# Patient Record
Sex: Female | Born: 1980 | Race: White | Hispanic: No | Marital: Married | Smoking: Current every day smoker
Health system: Southern US, Community
[De-identification: ages and names within clinical notes are randomized; demographics above are authoritative.]

## PROBLEM LIST (undated history)

## (undated) DIAGNOSIS — G8929 Other chronic pain: Secondary | ICD-10-CM

## (undated) DIAGNOSIS — N289 Disorder of kidney and ureter, unspecified: Secondary | ICD-10-CM

## (undated) DIAGNOSIS — G4700 Insomnia, unspecified: Secondary | ICD-10-CM

## (undated) DIAGNOSIS — M549 Dorsalgia, unspecified: Secondary | ICD-10-CM

## (undated) DIAGNOSIS — F1910 Other psychoactive substance abuse, uncomplicated: Secondary | ICD-10-CM

## (undated) DIAGNOSIS — K219 Gastro-esophageal reflux disease without esophagitis: Secondary | ICD-10-CM

## (undated) HISTORY — PX: FOOT SURGERY: SHX648

## (undated) HISTORY — PX: TUBAL LIGATION: SHX77

---

## 2002-12-13 ENCOUNTER — Inpatient Hospital Stay (HOSPITAL_COMMUNITY): Admission: AD | Admit: 2002-12-13 | Discharge: 2002-12-15 | Payer: Self-pay | Admitting: *Deleted

## 2003-07-23 ENCOUNTER — Emergency Department (HOSPITAL_COMMUNITY): Admission: EM | Admit: 2003-07-23 | Discharge: 2003-07-23 | Payer: Self-pay | Admitting: Emergency Medicine

## 2003-09-29 ENCOUNTER — Emergency Department (HOSPITAL_COMMUNITY): Admission: EM | Admit: 2003-09-29 | Discharge: 2003-09-30 | Payer: Self-pay | Admitting: Emergency Medicine

## 2003-10-05 ENCOUNTER — Emergency Department (HOSPITAL_COMMUNITY): Admission: EM | Admit: 2003-10-05 | Discharge: 2003-10-05 | Payer: Self-pay | Admitting: *Deleted

## 2003-11-26 ENCOUNTER — Observation Stay (HOSPITAL_COMMUNITY): Admission: AD | Admit: 2003-11-26 | Discharge: 2003-11-27 | Payer: Self-pay | Admitting: *Deleted

## 2003-12-26 ENCOUNTER — Ambulatory Visit (HOSPITAL_COMMUNITY): Admission: RE | Admit: 2003-12-26 | Discharge: 2003-12-26 | Payer: Self-pay | Admitting: *Deleted

## 2004-01-08 ENCOUNTER — Ambulatory Visit (HOSPITAL_COMMUNITY): Admission: AD | Admit: 2004-01-08 | Discharge: 2004-01-08 | Payer: Self-pay | Admitting: *Deleted

## 2004-01-25 ENCOUNTER — Ambulatory Visit (HOSPITAL_COMMUNITY): Admission: AD | Admit: 2004-01-25 | Discharge: 2004-01-25 | Payer: Self-pay | Admitting: *Deleted

## 2004-02-25 ENCOUNTER — Ambulatory Visit (HOSPITAL_COMMUNITY): Admission: AD | Admit: 2004-02-25 | Discharge: 2004-02-25 | Payer: Self-pay | Admitting: *Deleted

## 2004-03-18 ENCOUNTER — Ambulatory Visit (HOSPITAL_COMMUNITY): Admission: AD | Admit: 2004-03-18 | Discharge: 2004-03-18 | Payer: Self-pay | Admitting: *Deleted

## 2004-04-05 ENCOUNTER — Ambulatory Visit (HOSPITAL_COMMUNITY): Admission: AD | Admit: 2004-04-05 | Discharge: 2004-04-05 | Payer: Self-pay | Admitting: *Deleted

## 2004-04-27 ENCOUNTER — Ambulatory Visit (HOSPITAL_COMMUNITY): Admission: AD | Admit: 2004-04-27 | Discharge: 2004-04-27 | Payer: Self-pay | Admitting: *Deleted

## 2004-05-06 ENCOUNTER — Inpatient Hospital Stay (HOSPITAL_COMMUNITY): Admission: RE | Admit: 2004-05-06 | Discharge: 2004-05-07 | Payer: Self-pay | Admitting: Obstetrics and Gynecology

## 2004-06-06 ENCOUNTER — Emergency Department (HOSPITAL_COMMUNITY): Admission: EM | Admit: 2004-06-06 | Discharge: 2004-06-06 | Payer: Self-pay | Admitting: Emergency Medicine

## 2004-06-20 ENCOUNTER — Emergency Department (HOSPITAL_COMMUNITY): Admission: EM | Admit: 2004-06-20 | Discharge: 2004-06-20 | Payer: Self-pay | Admitting: Emergency Medicine

## 2004-07-02 ENCOUNTER — Emergency Department (HOSPITAL_COMMUNITY): Admission: EM | Admit: 2004-07-02 | Discharge: 2004-07-03 | Payer: Self-pay | Admitting: *Deleted

## 2004-07-31 ENCOUNTER — Emergency Department (HOSPITAL_COMMUNITY): Admission: EM | Admit: 2004-07-31 | Discharge: 2004-07-31 | Payer: Self-pay | Admitting: Emergency Medicine

## 2004-08-06 ENCOUNTER — Emergency Department (HOSPITAL_COMMUNITY): Admission: EM | Admit: 2004-08-06 | Discharge: 2004-08-07 | Payer: Self-pay | Admitting: Emergency Medicine

## 2004-08-24 ENCOUNTER — Emergency Department (HOSPITAL_COMMUNITY): Admission: EM | Admit: 2004-08-24 | Discharge: 2004-08-25 | Payer: Self-pay

## 2004-09-19 ENCOUNTER — Emergency Department (HOSPITAL_COMMUNITY): Admission: EM | Admit: 2004-09-19 | Discharge: 2004-09-19 | Payer: Self-pay | Admitting: Emergency Medicine

## 2004-10-07 ENCOUNTER — Ambulatory Visit: Payer: Self-pay | Admitting: Occupational Therapy

## 2004-10-09 ENCOUNTER — Emergency Department (HOSPITAL_COMMUNITY): Admission: EM | Admit: 2004-10-09 | Discharge: 2004-10-09 | Payer: Self-pay | Admitting: Emergency Medicine

## 2004-11-28 ENCOUNTER — Emergency Department (HOSPITAL_COMMUNITY): Admission: EM | Admit: 2004-11-28 | Discharge: 2004-11-28 | Payer: Self-pay | Admitting: Emergency Medicine

## 2004-12-18 ENCOUNTER — Emergency Department (HOSPITAL_COMMUNITY): Admission: EM | Admit: 2004-12-18 | Discharge: 2004-12-18 | Payer: Self-pay | Admitting: Emergency Medicine

## 2005-01-31 ENCOUNTER — Emergency Department (HOSPITAL_COMMUNITY): Admission: EM | Admit: 2005-01-31 | Discharge: 2005-01-31 | Payer: Self-pay | Admitting: *Deleted

## 2005-02-11 ENCOUNTER — Emergency Department (HOSPITAL_COMMUNITY): Admission: EM | Admit: 2005-02-11 | Discharge: 2005-02-12 | Payer: Self-pay | Admitting: *Deleted

## 2005-03-11 ENCOUNTER — Ambulatory Visit (HOSPITAL_COMMUNITY): Admission: RE | Admit: 2005-03-11 | Discharge: 2005-03-11 | Payer: Self-pay | Admitting: Family Medicine

## 2005-07-09 ENCOUNTER — Ambulatory Visit (HOSPITAL_COMMUNITY): Admission: AD | Admit: 2005-07-09 | Discharge: 2005-07-09 | Payer: Self-pay | Admitting: Obstetrics and Gynecology

## 2005-08-07 ENCOUNTER — Ambulatory Visit (HOSPITAL_COMMUNITY): Admission: AD | Admit: 2005-08-07 | Discharge: 2005-08-07 | Payer: Self-pay | Admitting: Obstetrics and Gynecology

## 2005-08-16 ENCOUNTER — Ambulatory Visit (HOSPITAL_COMMUNITY): Admission: AD | Admit: 2005-08-16 | Discharge: 2005-08-16 | Payer: Self-pay | Admitting: Obstetrics and Gynecology

## 2005-08-18 ENCOUNTER — Ambulatory Visit (HOSPITAL_COMMUNITY): Admission: AD | Admit: 2005-08-18 | Discharge: 2005-08-18 | Payer: Self-pay | Admitting: Obstetrics and Gynecology

## 2005-10-13 ENCOUNTER — Ambulatory Visit (HOSPITAL_COMMUNITY): Admission: RE | Admit: 2005-10-13 | Discharge: 2005-10-13 | Payer: Self-pay | Admitting: Obstetrics and Gynecology

## 2005-10-16 ENCOUNTER — Ambulatory Visit (HOSPITAL_COMMUNITY): Admission: RE | Admit: 2005-10-16 | Discharge: 2005-10-16 | Payer: Self-pay | Admitting: Obstetrics and Gynecology

## 2005-10-22 ENCOUNTER — Ambulatory Visit (HOSPITAL_COMMUNITY): Admission: AD | Admit: 2005-10-22 | Discharge: 2005-10-22 | Payer: Self-pay | Admitting: Obstetrics and Gynecology

## 2005-10-26 ENCOUNTER — Inpatient Hospital Stay (HOSPITAL_COMMUNITY): Admission: AD | Admit: 2005-10-26 | Discharge: 2005-10-28 | Payer: Self-pay | Admitting: Obstetrics and Gynecology

## 2005-12-01 ENCOUNTER — Emergency Department (HOSPITAL_COMMUNITY): Admission: EM | Admit: 2005-12-01 | Discharge: 2005-12-01 | Payer: Self-pay | Admitting: Emergency Medicine

## 2005-12-07 ENCOUNTER — Ambulatory Visit (HOSPITAL_COMMUNITY): Admission: RE | Admit: 2005-12-07 | Discharge: 2005-12-07 | Payer: Self-pay | Admitting: Obstetrics & Gynecology

## 2006-01-26 ENCOUNTER — Emergency Department (HOSPITAL_COMMUNITY): Admission: EM | Admit: 2006-01-26 | Discharge: 2006-01-26 | Payer: Self-pay | Admitting: Emergency Medicine

## 2006-03-01 ENCOUNTER — Emergency Department (HOSPITAL_COMMUNITY): Admission: EM | Admit: 2006-03-01 | Discharge: 2006-03-01 | Payer: Self-pay | Admitting: Emergency Medicine

## 2006-03-28 ENCOUNTER — Emergency Department (HOSPITAL_COMMUNITY): Admission: EM | Admit: 2006-03-28 | Discharge: 2006-03-29 | Payer: Self-pay | Admitting: Emergency Medicine

## 2006-04-26 ENCOUNTER — Emergency Department (HOSPITAL_COMMUNITY): Admission: EM | Admit: 2006-04-26 | Discharge: 2006-04-26 | Payer: Self-pay | Admitting: Emergency Medicine

## 2006-05-28 ENCOUNTER — Emergency Department (HOSPITAL_COMMUNITY): Admission: EM | Admit: 2006-05-28 | Discharge: 2006-05-28 | Payer: Self-pay | Admitting: Emergency Medicine

## 2006-07-02 ENCOUNTER — Emergency Department (HOSPITAL_COMMUNITY): Admission: EM | Admit: 2006-07-02 | Discharge: 2006-07-02 | Payer: Self-pay | Admitting: Emergency Medicine

## 2006-08-02 ENCOUNTER — Emergency Department (HOSPITAL_COMMUNITY): Admission: EM | Admit: 2006-08-02 | Discharge: 2006-08-02 | Payer: Self-pay | Admitting: Emergency Medicine

## 2006-08-12 ENCOUNTER — Emergency Department (HOSPITAL_COMMUNITY): Admission: EM | Admit: 2006-08-12 | Discharge: 2006-08-12 | Payer: Self-pay | Admitting: Emergency Medicine

## 2006-09-09 ENCOUNTER — Emergency Department (HOSPITAL_COMMUNITY): Admission: EM | Admit: 2006-09-09 | Discharge: 2006-09-09 | Payer: Self-pay | Admitting: Emergency Medicine

## 2006-10-02 ENCOUNTER — Emergency Department (HOSPITAL_COMMUNITY): Admission: EM | Admit: 2006-10-02 | Discharge: 2006-10-02 | Payer: Self-pay | Admitting: Emergency Medicine

## 2006-11-05 ENCOUNTER — Emergency Department (HOSPITAL_COMMUNITY): Admission: EM | Admit: 2006-11-05 | Discharge: 2006-11-05 | Payer: Self-pay | Admitting: Emergency Medicine

## 2007-01-17 ENCOUNTER — Emergency Department (HOSPITAL_COMMUNITY): Admission: EM | Admit: 2007-01-17 | Discharge: 2007-01-17 | Payer: Self-pay | Admitting: Emergency Medicine

## 2007-02-01 ENCOUNTER — Emergency Department (HOSPITAL_COMMUNITY): Admission: EM | Admit: 2007-02-01 | Discharge: 2007-02-01 | Payer: Self-pay | Admitting: Emergency Medicine

## 2007-05-09 ENCOUNTER — Emergency Department (HOSPITAL_COMMUNITY): Admission: EM | Admit: 2007-05-09 | Discharge: 2007-05-09 | Payer: Self-pay | Admitting: Emergency Medicine

## 2007-06-25 ENCOUNTER — Emergency Department (HOSPITAL_COMMUNITY): Admission: EM | Admit: 2007-06-25 | Discharge: 2007-06-25 | Payer: Self-pay | Admitting: Emergency Medicine

## 2007-08-06 ENCOUNTER — Emergency Department (HOSPITAL_COMMUNITY): Admission: EM | Admit: 2007-08-06 | Discharge: 2007-08-06 | Payer: Self-pay | Admitting: Emergency Medicine

## 2007-10-15 ENCOUNTER — Emergency Department (HOSPITAL_COMMUNITY): Admission: EM | Admit: 2007-10-15 | Discharge: 2007-10-15 | Payer: Self-pay | Admitting: Emergency Medicine

## 2007-11-02 ENCOUNTER — Emergency Department (HOSPITAL_COMMUNITY): Admission: EM | Admit: 2007-11-02 | Discharge: 2007-11-02 | Payer: Self-pay | Admitting: Emergency Medicine

## 2007-12-30 ENCOUNTER — Emergency Department (HOSPITAL_COMMUNITY): Admission: EM | Admit: 2007-12-30 | Discharge: 2007-12-30 | Payer: Self-pay | Admitting: Emergency Medicine

## 2008-01-21 ENCOUNTER — Emergency Department (HOSPITAL_COMMUNITY): Admission: EM | Admit: 2008-01-21 | Discharge: 2008-01-21 | Payer: Self-pay | Admitting: Emergency Medicine

## 2008-03-03 ENCOUNTER — Emergency Department (HOSPITAL_COMMUNITY): Admission: EM | Admit: 2008-03-03 | Discharge: 2008-03-03 | Payer: Self-pay | Admitting: Emergency Medicine

## 2008-03-08 ENCOUNTER — Emergency Department (HOSPITAL_COMMUNITY): Admission: EM | Admit: 2008-03-08 | Discharge: 2008-03-08 | Payer: Self-pay | Admitting: Emergency Medicine

## 2008-04-19 ENCOUNTER — Emergency Department (HOSPITAL_COMMUNITY): Admission: EM | Admit: 2008-04-19 | Discharge: 2008-04-19 | Payer: Self-pay | Admitting: Emergency Medicine

## 2008-05-24 ENCOUNTER — Emergency Department (HOSPITAL_COMMUNITY): Admission: EM | Admit: 2008-05-24 | Discharge: 2008-05-24 | Payer: Self-pay | Admitting: *Deleted

## 2008-07-16 ENCOUNTER — Emergency Department (HOSPITAL_COMMUNITY): Admission: EM | Admit: 2008-07-16 | Discharge: 2008-07-16 | Payer: Self-pay | Admitting: Emergency Medicine

## 2009-01-04 ENCOUNTER — Emergency Department (HOSPITAL_COMMUNITY): Admission: EM | Admit: 2009-01-04 | Discharge: 2009-01-04 | Payer: Self-pay | Admitting: Emergency Medicine

## 2010-03-16 ENCOUNTER — Emergency Department (HOSPITAL_COMMUNITY): Admission: EM | Admit: 2010-03-16 | Discharge: 2010-03-16 | Payer: Self-pay | Admitting: Emergency Medicine

## 2010-04-18 ENCOUNTER — Emergency Department (HOSPITAL_COMMUNITY): Admission: EM | Admit: 2010-04-18 | Discharge: 2010-04-18 | Payer: Self-pay | Admitting: Emergency Medicine

## 2010-08-01 ENCOUNTER — Emergency Department (HOSPITAL_COMMUNITY): Admission: EM | Admit: 2010-08-01 | Discharge: 2010-08-01 | Payer: Self-pay | Admitting: Emergency Medicine

## 2010-09-04 ENCOUNTER — Emergency Department (HOSPITAL_COMMUNITY): Admission: EM | Admit: 2010-09-04 | Discharge: 2010-09-04 | Payer: Self-pay | Admitting: Emergency Medicine

## 2010-11-21 ENCOUNTER — Emergency Department (HOSPITAL_COMMUNITY)
Admission: EM | Admit: 2010-11-21 | Discharge: 2010-11-21 | Payer: Self-pay | Source: Home / Self Care | Admitting: Emergency Medicine

## 2010-12-25 ENCOUNTER — Emergency Department (HOSPITAL_COMMUNITY): Payer: BC Managed Care – PPO

## 2010-12-25 ENCOUNTER — Emergency Department (HOSPITAL_COMMUNITY)
Admission: EM | Admit: 2010-12-25 | Discharge: 2010-12-25 | Discharge status: Against Medical Advice | Payer: BC Managed Care – PPO | Attending: Emergency Medicine | Admitting: Emergency Medicine

## 2010-12-25 DIAGNOSIS — M25579 Pain in unspecified ankle and joints of unspecified foot: Secondary | ICD-10-CM | POA: Insufficient documentation

## 2011-01-25 LAB — URINALYSIS, ROUTINE W REFLEX MICROSCOPIC
Bilirubin Urine: NEGATIVE
Glucose, UA: NEGATIVE mg/dL
Ketones, ur: NEGATIVE mg/dL
Nitrite: NEGATIVE
Protein, ur: NEGATIVE mg/dL
Specific Gravity, Urine: >1.03 — ABNORMAL HIGH (ref 1.005–1.030)
Urobilinogen, UA: 0.2 mg/dL (ref 0.0–1.0)
pH: 5 (ref 5.0–8.0)

## 2011-01-25 LAB — URINE CULTURE: Colony Count: 8000

## 2011-01-25 LAB — URINE MICROSCOPIC-ADD ON

## 2011-01-25 LAB — WET PREP, GENITAL
Clue Cells Wet Prep HPF POC: NONE SEEN
Trich, Wet Prep: NONE SEEN
WBC, Wet Prep HPF POC: NONE SEEN
Yeast Wet Prep HPF POC: NONE SEEN

## 2011-01-25 LAB — GC/CHLAMYDIA PROBE AMP, GENITAL
Chlamydia, DNA Probe: NEGATIVE
GC Probe Amp, Genital: NEGATIVE

## 2011-01-25 LAB — POCT PREGNANCY, URINE: Preg Test, Ur: NEGATIVE

## 2011-02-11 ENCOUNTER — Emergency Department (HOSPITAL_COMMUNITY)
Admission: EM | Admit: 2011-02-11 | Discharge: 2011-02-12 | Disposition: A | Discharge status: Home / Self Care | Payer: BC Managed Care – PPO | Attending: Emergency Medicine | Admitting: Emergency Medicine

## 2011-02-11 DIAGNOSIS — R10819 Abdominal tenderness, unspecified site: Secondary | ICD-10-CM | POA: Insufficient documentation

## 2011-02-11 DIAGNOSIS — K219 Gastro-esophageal reflux disease without esophagitis: Secondary | ICD-10-CM | POA: Insufficient documentation

## 2011-02-11 DIAGNOSIS — R112 Nausea with vomiting, unspecified: Secondary | ICD-10-CM | POA: Insufficient documentation

## 2011-02-11 DIAGNOSIS — R197 Diarrhea, unspecified: Secondary | ICD-10-CM | POA: Insufficient documentation

## 2011-02-11 DIAGNOSIS — R109 Unspecified abdominal pain: Secondary | ICD-10-CM | POA: Insufficient documentation

## 2011-02-12 ENCOUNTER — Emergency Department (HOSPITAL_COMMUNITY): Payer: BC Managed Care – PPO

## 2011-02-12 LAB — COMPREHENSIVE METABOLIC PANEL
ALT: 21 U/L (ref 0–35)
Alkaline Phosphatase: 65 U/L (ref 39–117)
BUN: 9 mg/dL (ref 6–23)
Chloride: 105 mEq/L (ref 96–112)
Glucose, Bld: 94 mg/dL (ref 70–99)
Potassium: 3.4 mEq/L — ABNORMAL LOW (ref 3.5–5.1)
Sodium: 138 mEq/L (ref 135–145)
Total Bilirubin: 0.1 mg/dL — ABNORMAL LOW (ref 0.3–1.2)

## 2011-02-12 LAB — URINALYSIS, ROUTINE W REFLEX MICROSCOPIC
Bilirubin Urine: NEGATIVE
Hgb urine dipstick: NEGATIVE
Ketones, ur: NEGATIVE mg/dL
Nitrite: NEGATIVE
Urobilinogen, UA: 0.2 mg/dL (ref 0.0–1.0)
pH: 6 (ref 5.0–8.0)

## 2011-02-12 LAB — CBC
HCT: 36.7 % (ref 36.0–46.0)
Hemoglobin: 11.5 g/dL — ABNORMAL LOW (ref 12.0–15.0)
MCV: 82.3 fL (ref 78.0–100.0)
RBC: 4.46 MIL/uL (ref 3.87–5.11)
WBC: 13.1 10*3/uL — ABNORMAL HIGH (ref 4.0–10.5)

## 2011-02-12 LAB — LIPASE, BLOOD: Lipase: 26 U/L (ref 11–59)

## 2011-02-12 LAB — DIFFERENTIAL
Lymphocytes Relative: 30 % (ref 12–46)
Lymphs Abs: 4 10*3/uL (ref 0.7–4.0)
Neutro Abs: 8.5 10*3/uL — ABNORMAL HIGH (ref 1.7–7.7)
Neutrophils Relative %: 65 % (ref 43–77)

## 2011-02-12 LAB — POCT PREGNANCY, URINE: Preg Test, Ur: NEGATIVE

## 2011-02-12 MED ORDER — IOHEXOL 300 MG/ML  SOLN
100.0000 mL | Freq: Once | INTRAMUSCULAR | Status: AC | PRN
  Administered 2011-02-12: 100 mL via INTRAVENOUS

## 2011-03-25 NOTE — H&P (Signed)
NAME:  GARLAND, SMOUSE                        ACCOUNT NO.:  [id number]   MEDICAL RECORD NO.:  [id number]                   PATIENT TYPE:  INP   LOCATION:  A418                                 FACILITY:  [facility]   PHYSICIAN:  Lazaro Arms, M.D.                DATE OF BIRTH:  11/21/1980   DATE OF ADMISSION:  05/06/2004  DATE OF DISCHARGE:                                HISTORY & PHYSICAL   HISTORY OF PRESENT ILLNESS:  April Boyer is a 30 year old gravida 3, para 2 with  an estimated date of delivery of 05/29/2004 currently at 36-6/[redacted] weeks  gestation who presented in active phase of labor.  She came in 3 to 4 cm,  50% effaced, and 0 station.  Amniotomy was performed with clear fluid.   PAST MEDICAL HISTORY:  Negative.   PAST SURGICAL HISTORY:  Tonsillectomy and adenoidectomy.   PAST OBSTETRICAL HISTORY:  Two vaginal deliveries.   MEDICATIONS:  1. Prenatal vitamins.  2. Lortab for back pain.   LABORATORY DATA:  Blood type is O positive, immune, hepatitis B negative,  HIV nonreactive.  Her HSV screening was both negative for 1 and 2.  Her  serology was nonreactive.  Her Glucola was normal.   IMPRESSION:  1. Intrauterine pregnancy at 36-6/[redacted] weeks gestation.  2. Active phase of labor.   PLAN:  Expectant management.     ___________________________________________                                         Lazaro Arms, M.D.   LHE/MEDQ  D:  05/07/2004  T:  05/07/2004  Job:  [id number]

## 2011-03-25 NOTE — H&P (Signed)
NAME:  April Boyer, April Boyer                        ACCOUNT NO.:  [id number]   MEDICAL RECORD NO.:  [id number]                   PATIENT TYPE:  OIB   LOCATION:  A404                                 FACILITY:  [facility]   PHYSICIAN:  Langley Gauss, M.D.                DATE OF BIRTH:  10/10/1981   DATE OF ADMISSION:  11/26/2003  DATE OF DISCHARGE:                                HISTORY & PHYSICAL   REASON FOR ADMISSION:  The patient is a 30 year old gravida 3, para 2 at 60-  5/[redacted] weeks gestation who comes in the office today complaining of vomiting  since 2030 last p.m.  She states she has vomited greater than 20 times and  has vomited all p.o. intake to include liquids.  In addition, even now  Popsicles have resulted in vomiting.  She also states her younger daughter  is also sick.  She denies any fever or chills.  She denies any diarrhea.  She does complain of some lower pelvic and back pain, with no spotting and  no diarrhea, as well as some weakness.  She does have some stinging p.o.  urination.  Pertinently, the patient has had multiple previous urinary tract  infections.  She states that about age 69 years old, she had a kidney  infection.  She, however, was not hospitalized at that time, but states she  was treated with p.o. outpatient antibiotics.  The patient has not had any  problems with urinary tract infection to this point during this pregnancy.  Urine has been negative on urine dipstick.   PAST MEDICAL HISTORY:  1. Tonsillectomy at age 51.  2. She has two vaginal deliveries without complications.   ALLERGIES:  No known drug allergies.   CURRENT MEDICATIONS:  Prenatal vitamins only.   SOCIAL HISTORY:  She smokes one-half pack per day.  Father of the baby at  the [facility].   PHYSICAL EXAMINATION:  GENERAL:  In no acute distress.  VITAL SIGNS:  Height 5 feet, 7 inches.  Pre pregnancy weight 186, weight on  November 11, 2003, 188.  Today's weight 181.  Blood pressure 113/83,  pulse  100.  Respiratory rate 20.  HEENT:  Negative, no adenopathy.  Recent extraction left upper molar noted  to be intact.  A temporary filling was placed.  NECK:  Supple.  Thyroid is not palpable.  EXTREMITIES:  Noted to be normal.  ABDOMEN:  Soft, nontender, gravid uterus identified consistent with [redacted] weeks  gestation.  No other abdominal or pelvic masses are appreciated.  No rebound  or guarding in any locations.  Fetal heart tones are auscultated in the  150's.   LABORATORY DATA:  Requested include prenatal profile 1 as well as  electrolytes.   ASSESSMENT AND PLAN:  A 12-week intrauterine pregnancy with less than 24  hour duration of nausea and vomiting with resultant 7 pound weight loss  since November 11, 2003.  The patient can not tolerate any p.o. intake at this  time.  Thus, patient management would be fruitless.  At this point in time,  the patient is referred to [facility] where she will receive IV  fluid resuscitative therapy as well as Phenergan initially IM.  Thereafter,  Phenergan IV as clinically indicated.  The patient will be hopefully  tolerating regular general diet.  On tenderness, she is noted to have some  right flank tenderness.  She will be treated empirically with Rocephin 1  gram IV q.12 hours.  Clean catch UA, C&S as well as laboratory studies  requested and tentatively pending.     ___________________________________________                                         Langley Gauss, M.D.   DC/MEDQ  D:  11/26/2003  T:  11/27/2003  Job:  [id number]

## 2011-03-25 NOTE — H&P (Signed)
NAME:  April Boyer, CERULLO              ACCOUNT NO.:  [id number]   MEDICAL RECORD NO.:  [id number]          PATIENT TYPE:  AMB   LOCATION:  DAY                           FACILITY:  [facility]   PHYSICIAN:  Lazaro Arms, M.D.   DATE OF BIRTH:  22-Jul-1981   DATE OF ADMISSION:  12/07/2005  DATE OF DISCHARGE:  LH                                HISTORY & PHYSICAL   Dekayla is a 30 year old white female, gravida 4, para 4, status post a  vaginal delivery in December.  She is requesting permanent sterilization.  She understands the permanent nature of the procedure.   PAST MEDICAL HISTORY:  Negative.   PAST SURGICAL HISTORY:  Tonsils.   PAST OBSTETRICAL HISTORY:  Four vaginal deliveries.   ALLERGIES:  None.   MEDICATIONS:  None.   REVIEW OF SYSTEMS:  Negative.   PHYSICAL EXAMINATION:  HEENT: Unremarkable.  NECK:  Thyroid is normal.  LUNGS:  Clear.  HEART: Regular rate and rhythm without regurgitation or gallop.  BREASTS: Deferred.  ABDOMEN:  Benign.  PELVIC: Normal postpartum.  Uterus is involuted.  Adnexa is negative.  EXTREMITIES:  Warm with no edema.  NEUROLOGIC: Grossly intact.   IMPRESSION:  Multiparous female desires permanent sterilization.   PLAN:  The patient is admitted for laparoscopic tubal ligation.  She  understands the risks, benefits, indications, and alternatives, failure rate  of 1:300 and permanent nature of the procedure.     Lazaro Arms, M.D.  Electronically Signed    LHE/MEDQ  D:  12/06/2005  T:  12/06/2005  Job:  [id number]

## 2011-03-25 NOTE — H&P (Signed)
NAME:  April Boyer, April Boyer              ACCOUNT NO.:  [id number]   MEDICAL RECORD NO.:  [id number]          PATIENT TYPE:  INP   LOCATION:  A401                          FACILITY:  [facility]   PHYSICIAN:  Lazaro Arms, M.D.   DATE OF BIRTH:  December 02, 1980   DATE OF ADMISSION:  DATE OF DISCHARGE:  LH                                HISTORY & PHYSICAL   April Boyer is a 30 year old, gravida 4, para 3 abortus 0, who is at [redacted] weeks  gestation, admitted in the active phase of labor.  She has a history of  relatively rapid labors in the past.   PAST MEDICAL HISTORY:  Negative.   PAST SURGICAL HISTORY:  Tonsils.   PAST OB HISTORY:  She has had three vaginal deliveries in 2001, 2004, and  most recently 2005.   PRENATAL LABS:  Blood type is A positive. Antibody screen is negative.  Rubella immune.  Hepatitis B was negative.  HIV nonreactive.  Serology  nonreactive.  Pap smear normal. GC and Chlamydia were normal.  Glucola was  normal.  Group B strep was negative.   IMPRESSION:  1.  Serum pregnancy at [redacted] weeks gestation.  2.  Active phase of labor.   PLAN:  Labor management for vaginal delivery.      Lazaro Arms, M.D.  Electronically Signed     LHE/MEDQ  D:  11/23/2005  T:  11/23/2005  Job:  [id number]

## 2011-03-25 NOTE — Consult Note (Signed)
NAME:  April Boyer, April Boyer              ACCOUNT NO.:  [id number]   MEDICAL RECORD NO.:  [id number]          PATIENT TYPE:  OIB   LOCATION:  A415                          FACILITY:  [facility]   PHYSICIAN:  Lazaro Arms, M.D.   DATE OF BIRTH:  30-Mar-1981   DATE OF CONSULTATION:  07/09/2005  DATE OF DISCHARGE:  07/09/2005                                   CONSULTATION   [name] is a 30 year old female at [redacted] weeks gestation complaining of  pressure in her epigastric and above her umbilicus area for the past two to  three days.  She does not localize it.  It is pretty much in the middle.  CBC was performed.  Comprehensive metabolic panel and an amylase.  Amylase  was slightly elevated.  Lipase was normal.  She did not have right upper  quadrant pain.  She had not been throwing up.  She was given Prevacid and  Maalox which significantly helped relieve her pain.   IMPRESSION:  The patient is suffering with esophageal spasm and/or reflux  and less likely gallbladder disease.  She will call the office later this  week to see how she is doing and at that time may undergo an ultrasound for  evaluation of gallbladder disease if she is no better or continue on proton-  pump inhibitor.  She was given routine instructions and precautions and  discharged to home.      Lazaro Arms, M.D.  Electronically Signed     LHE/MEDQ  D:  07/12/2005  T:  07/12/2005  Job:  [id number]

## 2011-03-25 NOTE — Op Note (Signed)
NAME:  April Boyer, April Boyer              ACCOUNT NO.:  [id number]   MEDICAL RECORD NO.:  [id number]          PATIENT TYPE:  OIB   LOCATION:  LDR2                          FACILITY:  [facility]   PHYSICIAN:  Tilda Burrow, M.D. DATE OF BIRTH:  [birth year]   DATE OF PROCEDURE:  10/13/2005  DATE OF DISCHARGE:  10/13/2005                                 OPERATIVE REPORT   She was seen in labor and delivery on October 13, 2005 with complaints of  some contractions. She was evaluated for two hours and was not having any  contractions. She said that they stopped when she got here. She is 36 weeks  1 day gestation. Vaginal exam revealed a closed, thick cervix. Fetal heart  rate was reactive.   IMPRESSION:  Intrauterine pregnancy at 36 weeks. Contractions, resolved.      Jacklyn Shell, C.N.M.      Tilda Burrow, M.D.  Electronically Signed    FC/MEDQ  D:  10/18/2005  T:  10/18/2005  Job:  [id number]   cc:   [facility]

## 2011-03-25 NOTE — Discharge Summary (Signed)
NAME:  April Boyer, April Boyer                        ACCOUNT NO.:  [id number]   MEDICAL RECORD NO.:  [id number]                   PATIENT TYPE:  OIB   LOCATION:  A415                                 FACILITY:  [facility]   PHYSICIAN:  Langley Gauss, M.D.                DATE OF BIRTH:  [birth year]   DATE OF ADMISSION:  01/25/2004  DATE OF DISCHARGE:  01/25/2004                                 DISCHARGE SUMMARY   OB OBSERVATION NOTE:  Date of observation is January 25, 2004.   HISTORY:  This is a 30 year old gravida 3, para 2 and 21-6/7-weeks gestation  who presents with a chief complaint of lower back and abdominal pain, pelvic  pressure since Friday p.m.  The patient describes the back, lower lumbar  pain, abdominal pain, more specifically suprapubic pressure, to be nearly  constant in nature.  She states that there is only a very occasional and  rare tightening, but predominately it is the suprapubic pressure.  She has  experienced this with previous pregnancies, but not to this degree, this  early on.  She has been taking Lortab 10/500 1 q.4h. with good relief, but  is concerned that the pain does recur.  Pertinently, she has been active  around the home.  She has a 27-year-old who weighs 30 pounds whom she  continues to lift, and she had questions whether this could possibly be  responsible for exacerbating her back pain.  In addition, her youngest child  is currently 82-year-old.   Pertinently the patient describes good fetal movement.  She denies any  vaginal bleeding, any leakage of fluid or change in vaginal discharge.  The  pain, of course, otherwise, has been complicated to date only by prior  evaluation on labor and delivery, about 3 weeks previously, for near  identical symptoms.  She states that she did have a previous ultrasound done  December 28, 2003, which gives her current [facility].   PAST MEDICAL HISTORY:  She has 2 prior vaginal deliveries without  complications.   PHYSICAL  EXAMINATION:  GENERAL:  Appears much greater than stated age in no  acute distress.  VITAL SIGNS:  97.5, 87, 20, 106/58.  ABDOMEN:  Soft, nontender. Gravid uterus identified consistent with [redacted] weeks  gestation.  EXTREMITIES:  Noted to be normal.  PELVIC:  Per the nursing staff.  Cervix is very far posterior, long and  thick, actually unable to reach the cervix.  No vaginal discharge  identified.  External fetal monitor reveals fetal heart rate baseline in the  150s with normal long-term variability for a 22-week estimated gestational  age.  There is one fetal heart rate deceleration noted, variable-type in  nature, down to 70 beats/minute with the entire fall and rise being less  than 30 seconds in duration.  This is only on one occurrence during the  fetal heart rate monitoring strip.  Thus, for the indication of fetal  heart  rate deceleration, a limited  transabdominal OB ultrasound is performed and  interpreted by Dr. Roylene Reason. Lisette Grinder.  Limited ultrasound greater than [redacted]  weeks gestation reveals a single intrauterine pregnancy, fetal cardiac  activity is identified.  Good fetal movement is identified.  Good fetal  tone.  Subjectively, normal amniotic fluid volume in the four quadrants  noted.  Currently, in a vertex presentation, posterior placenta.  Biparietal  diameter.  BPD is obtained which is consistent with 21-2/[redacted] weeks gestation.   ASSESSMENT:  1. Lumbar back pain.  2. Suprapubic pelvic pressure, both of these are found to be consistent     complaints in this patient with poor abdominal wall muscle tone and non     multiparous with pregnancies very close together.  She is advised that     this pain can be expected to increase during the pregnancy.  The only     recommendation I can make at this time is that she obtain a prenatal     cradle pregnancy pelvic support device which will allow a way for the     baby to be transferred to her shoulders.  She will continue the  Lortab     10/500 which has been giving good relief to this point in time.  She is     also advised to minimize lifting of her 30 pound child.  3. Fetal heart rate deceleration.  This is known to be a limited event with     normal amniotic fluid volume on the ultrasound.  No causative factors     identified.  Thus, the patient is advised at this time of the importance     of fetal ketones, and will continue with previously recommended prenatal     care.     ___________________________________________                                         Langley Gauss, M.D.   DC/MEDQ  D:  01/25/2004  T:  01/26/2004  Job:  [id number]

## 2011-03-25 NOTE — Discharge Summary (Signed)
NAME:  April Boyer, April Boyer                        ACCOUNT NO.:  [id number]   MEDICAL RECORD NO.:  [id number]                   PATIENT TYPE:  INP   LOCATION:  A418                                 FACILITY:  [facility]   PHYSICIAN:  Lazaro Arms, M.D.                DATE OF BIRTH:  December 17, 1980   DATE OF ADMISSION:  05/06/2004  DATE OF DISCHARGE:  05/07/2004                                 DISCHARGE SUMMARY   DISCHARGE DIAGNOSES:  1. Status post a normal spontaneous vaginal delivery.  2. Unremarkable postpartum course.   Please refer to the antepartum chart in the H&P for details of admission to  the hospital.   HOSPITAL COURSE:  The patient progressed quickly through labor.  She had an  epidural placed, which she tolerated.  She delivered over an intact perineum  with a first degree laceration a viable female with Apgars of 9 and 9.  During  her postpartum course, she remained afebrile, tolerated clear liquids, and a  regular diet.  She was discharged to home on the morning of postoperative  day #1 in good stable condition.  Her postpartum #1 hemoglobin and  hematocrit were excellent at 9.0 and 26.6, respectively, which compared  favorably to her before delivery blood counts.  She was discharged to home  on Motrin and Tylox for pain.  She will be seen in the office in four to  five weeks for postpartum exam.     ___________________________________________                                         Lazaro Arms, M.D.   LHE/MEDQ  D:  05/25/2004  T:  05/25/2004  Job:  [id number]

## 2011-03-25 NOTE — Discharge Summary (Signed)
NAME:  April Boyer, April Boyer                        ACCOUNT NO.:  [id number]   MEDICAL RECORD NO.:  [id number]                   PATIENT TYPE:  OIB   LOCATION:  A415                                 FACILITY:  [facility]   PHYSICIAN:  Langley Gauss, M.D.                DATE OF BIRTH:  Dec 21, 1980   DATE OF ADMISSION:  01/08/2004  DATE OF DISCHARGE:  01/08/2004                                 DISCHARGE SUMMARY   HISTORY OF PRESENT ILLNESS:  This is a 30 year old gravida 3, para 2, 49-  week gestation who presents to [facility] complaining of 2 day  duration of intermittent abdominal pain.  She has also had some bloody  stools.  She states the abdominal pain is very intermittent in nature.  She  does not give a good history of uterine tightening, rather intermittent  episodes of pelvic pressure which may last several hours duration.  She has  difficulty getting comfortable.  At other long periods of time, she is  completely pain free.  She denies any change in her vaginal discharge, most  pertinently, no change in vaginal discharge as far as itching, burning odor.  No vaginal bleeding.  No leakage of fluid.  She does describe good fetal  movement.   The patient has a bowel movement about every other day.  She states she  frequently has pain with the bowel movements.  Several days ago, she did  have some bleeding with a bowel movement.  Since that point in time, she has  noted a very small amount of bright red blooding with bowel movements only.  She did not have significant history of chronic constipation.  She has not  previously taken any stool softeners.  She denies any diarrhea or any blood  at any time other than recent with movement of her bowels.   PAST MEDICAL HISTORY:  1. She has two prior vaginal deliveries.  2. Tonsillectomy at age 10.   SOCIAL HISTORY:  Smokes one half pack per day.  Father of the baby is named  Magdalene Molly.  The patient is unemployed.  She is separated  from her ex-  husband, somewhat of an antagonistic relationship now exists.  She is noted  to have a significant amount of free floating anxiety.  Has previously been  treated with Vistaril 50 mg p.o. q.i.d. which she states has no affect, and  thus, she has discontinued its use.   PHYSICAL EXAMINATION:  GENERAL APPEARANCE:  No acute distress.  Three other  family members in the room.  VITAL SIGNS:  Temperature 98.5, pulse 88, respirations 20, blood pressure  114/73.  HEENT:  Negative.  ABDOMEN:  Soft and nontender, consistent with 19-week gestation.  Fetal  movement is palpated.  Her uterus is soft and nontender.  PELVIC:  Normal external genitalia.  No lesions or ulcerations identified.  Cervix is noted to be closed, 4 cm long,  very far posterior and very firm.  RECTAL:  No evidence of any vaginal bleeding.  No obvious hemorrhoids are  identified.   ASSESSMENT/PLAN:  1. Patient with very vague abdominal pain consistent with typical complaints     associated with pregnancy.  No history of preterm labor.  Cervix     completely stable on examination.  The patient is recommended to have     modified bed rest as clinically indicated.  She is treated short term     with Ambien 10 mg p.o. q.h.s. for therapeutic risks.  2. Patient with history of constipation.  Straining with stools now with     minimal episodes of rectal bleeding.  The patient is advised to take over-     the-counter stool softeners and continue with her prenatal vitamins.  3. History of reflux.  The patient most recently has had episodes of minimal     amounts of acid reflux throughout her esophageal area.  She has taken her     father's Nexium which she has done well with, though I did not condone     her taking other's medication.  She seems to have a good response to the     Nexium.   DISPOSITION:  The patient is discharged home at this time.  She can continue  with prenatal care as per previous  recommendations.     ___________________________________________                                         Langley Gauss, M.D.   DC/MEDQ  D:  01/08/2004  T:  01/09/2004  Job:  [id number]

## 2011-03-25 NOTE — H&P (Signed)
NAME:  April Boyer, April Boyer                        ACCOUNT NO.:  [id number]   MEDICAL RECORD NO.:  [id number]                   PATIENT TYPE:  INP   LOCATION:  A428                                 FACILITY:  [facility]   PHYSICIAN:  Langley Gauss, M.D.                DATE OF BIRTH:  [birth year]   DATE OF ADMISSION:  12/13/2002  DATE OF DISCHARGE:  12/15/2002                                HISTORY & PHYSICAL   HISTORY OF PRESENT ILLNESS:  This 30 year old gravida 2, para 1 with an EDC  of 12/30/02 who is visiting from [place] where she has received prenatal  care.  The patient complains of uterine contractions last p.m. and  continuing all day today.  She had been seen previously 1-day previously at  [facility] at which time she was evaluated and noted to be having  irregular uterine contractions and, by patient report, noted to be 3 cm  dilated.  After there was no cervical change times several hours the patient  was discharged to home.  The patient now presents to [facility]  again having irregular uterine contractions.  The patient's prenatal course  records are obtained from Dr. Orvan Falconer in [place] and reviewed.  Prenatal  course complicated only by an unknown group B Strep status.  Thus, she will  be treated during the course of labor.   PAST MEDICAL HISTORY:  She has 1 prior vaginal delivery 06/08/00 in [place] of  a 7 pound 9 ounce female infant.  The patient states that this was following a  12-hour labor.  No complications.   ALLERGIES:  The patient has no known drug allergies.   CURRENT MEDICATIONS:  Prenatal vitamins.   PHYSICAL EXAMINATION:  GENERAL:  No acute distress.  VITAL SIGNS:  Blood pressure 127/76, pulse of 87, respiratory rate is 20.  Temperature is 97.5.  HEENT:  Negative.  NECK:  No adenopathy.  Neck is supple.  Thyroid is not palpable.  LUNGS:  Clear.  CARDIOVASCULAR:  Regular rate and rhythm.  ABDOMEN:  The abdomen is soft and nontender.  No  surgical scars are  identified.  Vertex presentation by Leopold's maneuvers with a fundal height  of 38 cm.  PELVIC:  Normal external genitalia.  No lesions or ulcerations identified.  No leakage of fluid.  No vaginal bleeding.  External fetal monitor reveals a  reassuring fetal heart rate, irregular uterine contractions are identified.  However, pelvic examination performed reveals cervix to be 5 cm dilated, 80%  effaced and 0 station.   X-RAY AND LABORATORY DATA:  Unknown group B Strep status.  A positive blood  type.  Rubella immune.  RPR is nonreactive.  GC and Chlamydia cultures  negative.  GPQ result not available, but the patient states was normal.  Hemoglobin 10.4, hematocrit 32.0.    ASSESSMENT:  A 37-to-38-week intrauterine pregnancy in early stages of  active labor; despite only having  irregular uterine contractions the patient  has progressed to 5 cm.  Thus, at this time, she is admitted.  Amniotomy was  performed.  The patient will be treated empirically, during the course of  labor, due to unknown group B Strep status.  Pelvis is noted to be  clinically adequate.                                               Langley Gauss, M.D.    DC/MEDQ  D:  12/17/2002  T:  12/18/2002  Job:  [id number]

## 2011-03-25 NOTE — Op Note (Signed)
NAME:  April Boyer, April Boyer              ACCOUNT NO.:  [id number]   MEDICAL RECORD NO.:  [id number]          PATIENT TYPE:  INP   LOCATION:  A401                          FACILITY:  [facility]   PHYSICIAN:  Lazaro Arms, M.D.   DATE OF BIRTH:  10-15-1981   DATE OF PROCEDURE:  10/26/2005  DATE OF DISCHARGE:  10/28/2005                                 OPERATIVE REPORT   EPIDURAL NOTE:  [name] is a gravida 4, para 3 in active phase labor  requesting an epidural be placed.   DESCRIPTION OF PROCEDURE:  She is placed in sitting position.  Betadine prep  is used.  Lidocaine 1% is injected at the L3-L4 interspace as a local  anesthetic.  The area is field draped.  A 17-gauge Touhy needle was used and  a loss of resistance technique is employed; and the epidural space is found  with one pass without difficulty.  Then 10 mL of 0.125% bupivacaine plain is  given to the epidural space as a test dose without will effects.  The  epidural catheter is then fed into the epidural space.  An additional 10 mL  was given to dose up the epidural.  It is taped down at that point.  The  continuous infusion of 0.125% bupivacaine with 2 mcg/mL of Fentanyl was  begun at 12 mL an hour; and the patient is getting good pain relief.  The  fetal heart rate tracing is stable and the blood pressure is stable.      Lazaro Arms, M.D.  Electronically Signed     LHE/MEDQ  D:  11/23/2005  T:  11/23/2005  Job:  [id number]

## 2011-03-25 NOTE — Group Therapy Note (Signed)
NAMEDIANN, BANGERTER              ACCOUNT NO.:  [id number]   MEDICAL RECORD NO.:  [id number]          PATIENT TYPE:  OIB   LOCATION:  A415                          FACILITY:  [facility]   PHYSICIAN:  Tilda Burrow, M.D. DATE OF BIRTH:  05/08/1981   DATE OF PROCEDURE:  DATE OF DISCHARGE:  10/22/2005                                   PROGRESS NOTE   PREOPERATIVE DIAGNOSIS:  Pregnancy, 37-1/[redacted] weeks gestation. Extreme pelvic  pressure related to grand multiparity and advanced pregnancy.   HISTORY OF PRESENT ILLNESS:  This is a 30 year old female, gravida 4, para  3, with pregnancy followed through our office through 11 prenatal visits to  date who is seen today to rule out labor. She was placed on Ambien earlier  this week. Cervix is quite advanced in its favorability. At December, 2006,  she was long, closed and high, but since then has progressed and been  several times in the past week to rule out labor. Cervix is gradually  progressing. At this time, she is 4 cm dilated at the internal os,  uneffaced, with no labor pattern noted. She is just miserable with the  pelvic pressure. There is no contractions. Fetal heart rate tracing shows  excellent reactivity. After discussion with Janeene, we are going to send  her home on Vicodin ES to assist her with the discomfort. She is to return  when the contractions pick up, if they do. The patient will be induced on  Thursday, December 21, if she does not go into labor on her own between now  and then, scheduled for induction on October 27, 2005 at 7 a.m.      Tilda Burrow, M.D.  Electronically Signed     JVF/MEDQ  D:  10/22/2005  T:  10/24/2005  Job:  [id number]

## 2011-03-25 NOTE — Discharge Summary (Signed)
NAME:  April Boyer, April Boyer                        ACCOUNT NO.:  [id number]   MEDICAL RECORD NO.:  [id number]                   PATIENT TYPE:  OIB   LOCATION:  A404                                 FACILITY:  [facility]   PHYSICIAN:  Langley Gauss, M.D.                DATE OF BIRTH:  07-May-1981   DATE OF ADMISSION:  11/26/2003  DATE OF DISCHARGE:  11/27/2003                                 DISCHARGE SUMMARY   DIAGNOSES:  1. Twelve-week intrauterine pregnancy.  2. Nausea and vomiting consistent with viral gastroenteritis.   PERTINENT LABORATORY DATA:  Antibody screen is noted to be negative. Rubella  immune. RPR is nonreactive. Hemoglobin 11.9, hematocrit 35.5 with a white  count of 6.8. Electrolytes within normal limits. Remainder of prenatal  profile which was ordered during this hospitalization should be forthcoming.   HOSPITAL COURSE:  The patient was seen in the office on November 26, 2003 as  a workup obstetrical patient with complaints of significant nausea with  vomiting, resultant dehydration, and weight loss. She was referred to [facility] at which time she was treated with IV fluids as well as IV  Phenergan. She did very well on this regimen such that she was able to  increase her p.o. intake, was tolerating regular general diet with no  addition nausea and vomiting or diarrhea. The patient in fact was up  frequently ambulatory to smoke. On evaluation on November 27, 2003, the  patient remained afebrile. She was tolerating a regular general diet. She  had no further complaints of nausea and vomiting. As such, she was  discharged to home on November 27, 2003. She did complain of some right flank  pain. Urine culture was currently pending at that time. Thus, per phone  discussion with labor and delivery, the patient was to come by the office  and pick up a prescription for Macrodantin 100-mg tablet p.o. b.i.d. for 7  days. In addition, Phenergan tablets 25 mg p.o. q.6h. p.r.n.  Thus, final  discharge observation period January 19 to November 27, 2003. Twelve-week  intrauterine pregnancy. Nausea and vomiting, resultant weight loss. No  metabolic disturbance identified.   FINAL DIAGNOSES:  Viral gastroenteritis.   DISPOSITION:  The patient is to follow up in the office in two weeks' time  or sooner if any additional concerns arise.     ___________________________________________                                         Langley Gauss, M.D.   DC/MEDQ  D:  12/01/2003  T:  12/01/2003  Job:  [id number]

## 2011-03-25 NOTE — Op Note (Signed)
NAME:  April Boyer, April Boyer              ACCOUNT NO.:  [id number]   MEDICAL RECORD NO.:  [id number]          PATIENT TYPE:  INP   LOCATION:  A401                          FACILITY:  [facility]   PHYSICIAN:  Lazaro Arms, M.D.   DATE OF BIRTH:  [birth year]   DATE OF PROCEDURE:  10/26/2005  DATE OF DISCHARGE:  10/28/2005                                 OPERATIVE REPORT   [name] is a 30 year old gravida 4, para 3 at [redacted] weeks gestation who has  progressed quickly through the active phase of labor. She underwent an  artifical amniotomy and epidural placement. Over an intact perineum, she  delivered a viable female infant at 5 with Apgars of 9 and 10, weight 5  pounds and 15 ounces. There was a three-vessel cord. Cord blood and cord gas  sent. There was a loose nuchal cord x1. The infant underwent routine  neonatal resuscitation by the nursing staff. The placenta was delivered  spontaneously. The uterus was contracted down nicely. There was 250 cc of  blood loss for the delivery. Again, there were no lacerations noted at this  time. The patient will undergone routine postpartum care.      Lazaro Arms, M.D.  Electronically Signed     LHE/MEDQ  D:  11/23/2005  T:  11/23/2005  Job:  [id number]

## 2011-03-25 NOTE — Op Note (Signed)
NAME:  April Boyer, April Boyer              ACCOUNT NO.:  [id number]   MEDICAL RECORD NO.:  [id number]          PATIENT TYPE:  OIB   LOCATION:  LDR2                          FACILITY:  [facility]   PHYSICIAN:  Tilda Burrow, M.D. DATE OF BIRTH:  08-12-1981   DATE OF PROCEDURE:  DATE OF DISCHARGE:  08/18/2005                                 OPERATIVE REPORT   [name] came into labor and delivery with complaints of having a one-time  episode of vaginal wetness 36 hours prior to her coming to the hospital. She  has not had any more episodes since. No evidence of ruptured membranes. No  leaking of fluid at all. Nitrazine negative vaginal discharge. No  contractions. Cervix is long, thick, and closed. The fetal heart rate was  within normal limits with reactive accelerations present.      Jacklyn Shell, C.N.M.      Tilda Burrow, M.D.  Electronically Signed    FC/MEDQ  D:  08/19/2005  T:  08/19/2005  Job:  [id number]

## 2011-03-25 NOTE — Op Note (Signed)
NAME:  April Boyer, April Boyer                        ACCOUNT NO.:  [id number]   MEDICAL RECORD NO.:  [id number]                   PATIENT TYPE:  INP   LOCATION:  A428                                 FACILITY:  [facility]   PHYSICIAN:  Langley Gauss, M.D.                DATE OF BIRTH:  1981/06/12   DATE OF PROCEDURE:  12/14/2002  DATE OF DISCHARGE:                                 OPERATIVE REPORT   PREOPERATIVE DIAGNOSES:  1. Thirty-nine plus weeks' intrauterine pregnancy presenting in labor.  2. The patient presents to [facility] as an obstetrical unassigned     patient.   PROCEDURE PERFORMED:  Spontaneous assisted vaginal delivery, 7-pound, 7-  ounce female infant delivered over an intact perineum.   DELIVERY PERFORMED:  Langley Gauss, M.D.   COMPLICATIONS:  None.   SPECIMENS:  Arterial cord gas and cord blood to pathology laboratory.   FINDINGS AT TIME OF DELIVERY:  Nuchal cord x1 as well as the umbilical cord  around both legs x1.  These findings of the umbilical cord were without  compression as no fetal heart rate decelerations were noted.   ESTIMATED BLOOD LOSS:  Less than 500 mL.   SUMMARY:  The patient presented to [facility] and there was no OB  assigned at 39+ weeks' gestation.  She did have a reassuring fetal heart  rate on external fetal monitor and what appeared to be only occasional  uterine contractions.  She gave the history that the p.m. prior she had been  seen at [facility] at which time she was examined and noted to be 3  cm dilated and sent home not in labor.  When I examined her initially at  [facility], she was noted to be 5 cm dilated, 80% effaced with the vertex at  the 0 station.  Thus, patient was admitted in labor.  Amniotomy was  performed with findings of clear amniotic fluid.  Fetal scalp electrode was  placed so as to document the reassuring fetal heart rate.  Thereafter, the  patient did progress rapidly along the labor curve  to complete dilatation.  During the course of labor she requested only IV Nubain for pain relief.  She did well with this.  Upon reaching complete dilatation, she was placed  in the dorsal lithotomy position, prepped and draped in the usual sterile  manner.  She pushed well during a short second stage of labor to deliver in  a direct OA position over an intact perineum.  Mouth and nares were bulb  suctioned of clear amniotic fluid.  A nuchal cord x1 was reduced.  Spontaneous dilatation then occurred to a left anterior shoulder position.  Gentle downward traction combined with expulsive efforts resulted in an  anterior shoulder delivery being at the pubic symphysis without difficulty.  The umbilical cord is then milked towards the infant and the infant was  handed to the  nursery staff for immediate assessment.  Arterial cord gas and  cord blood were then obtained.  Gentle traction of the umbilical cord  resulted in separation which upon examination, appeared to be an intact  three-vessel placenta and attached cord.  Examination  of genital tract revealed no lacerations.  The perineum was noted to be  intact.  Excellent uterine tone was noted after performing bimanual massage  of the uterine fundus.  The patient is thus taken out of dorsal lithotomy  position and allowed to bond with the infant.                                               Langley Gauss, M.D.    DC/MEDQ  D:  12/14/2002  T:  12/14/2002  Job:  [id number]

## 2011-03-25 NOTE — Op Note (Signed)
NAME:  April Boyer, April Boyer              ACCOUNT NO.:  [id number]   MEDICAL RECORD NO.:  [id number]          PATIENT TYPE:  AMB   LOCATION:  DAY                           FACILITY:  [facility]   PHYSICIAN:  Lazaro Arms, M.D.   DATE OF BIRTH:  08/02/1981   DATE OF PROCEDURE:  12/07/2005  DATE OF DISCHARGE:                                 OPERATIVE REPORT   PREOPERATIVE DIAGNOSIS:  Multiparous female, desires permanent  sterilization.   POSTOPERATIVE DIAGNOSIS:  Multiparous female, desires permanent  sterilization.   PROCEDURE:  Laparoscopic tubal ligation.   SURGEON:  Dr. Despina Hidden.   ANESTHESIA:  General endotracheal.   FINDINGS:  The patient had normal uterus, tubes and ovaries. Normal  intraperitoneal cavity.   DESCRIPTION OF OPERATION:  The patient was taken to the operating room and  placed in a supine position with general endotracheal anesthesia, placed in  dorsal lithotomy position, prepped and draped in the usual sterile fashion.  Incision was made in the umbilicus. Veress needle was used and placed in the  peritoneal cavity with one pass without difficulty. The peritoneal cavity  was insufflated without difficulty. A nonbladed, direct visualization trocar  under direct visualization without difficulty. The fallopian tubes were  identified. A 2-cm segment in the distal isthmic and ampullary region of  each tube was burned to no resistance and beyond using electrocautery unit.  There was good hemostasis bilaterally. The instruments were removed. The gas  was allowed to escape. The fascia was closed with 0 Vicryl. The subcutaneous  tissue was closed with 0 Vicryl, and the skin was closed using skin staples.  The patient tolerated the procedure well. She experienced minimal blood loss  and was taken to the recovery room in good and stable condition. All counts  were correct.      Lazaro Arms, M.D.  Electronically Signed     LHE/MEDQ  D:  12/07/2005  T:  12/07/2005  Job:   [id number]

## 2011-03-25 NOTE — Op Note (Signed)
NAME:  April Boyer, April Boyer                        ACCOUNT NO.:  [id number]   MEDICAL RECORD NO.:  [id number]                   PATIENT TYPE:  INP   LOCATION:  A418                                 FACILITY:  [facility]   PHYSICIAN:  Lazaro Arms, M.D.                DATE OF BIRTH:  1981/10/09   DATE OF PROCEDURE:  05/06/2004  DATE OF DISCHARGE:  05/07/2004                                  PROCEDURE NOTE   PROCEDURE:  Epidural procedure.   OBSTETRICIAN:  Lazaro Arms, M.D.   INDICATION:  [name] is a 30 year old gravida 3, para 2, in active phase of  labor, requesting an epidural to be placed.   DESCRIPTION OF PROCEDURE:  She received IV pain medicines.  She was placed  in a sitting position.  Betadine prep was used.  She was field-draped.  The  L2-L-3 interspace was identified; local anesthetic was placed in the form of  1% lidocaine.  A 17-gauge Tuohy needle was used with loss-of-resistance  technique employed and the epidural space was found with 1 pass without  difficulty.  Ten milliliters of 0.125% bupivacaine plain were given through  the epidural space.  The epidural catheter was fed without difficulty and  the Tuohy needle was removed.  It was taped down 5 cm into the space.  Ten  milliliters of 0.125% bupivacaine were given to dose-up the epidural without  ill-effects.  The continuous infusion was then placed at 12 mL an hour.  The  patient tolerated the procedure well without any problems and is getting  good pain relief.      ___________________________________________                                            Lazaro Arms, M.D.   LHE/MEDQ  D:  05/25/2004  T:  05/25/2004  Job:  [id number]

## 2011-03-25 NOTE — Discharge Summary (Signed)
NAME:  ALIRA, FRETWELL                        ACCOUNT NO.:  [id number]   MEDICAL RECORD NO.:  [id number]                   PATIENT TYPE:  OIB   LOCATION:  A418                                 FACILITY:  [facility]   PHYSICIAN:  Langley Gauss, M.D.                DATE OF BIRTH:  06-13-1981   DATE OF ADMISSION:  03/18/2004  DATE OF DISCHARGE:  03/18/2004                                 DISCHARGE SUMMARY   The patient is a 30 year old, gravida 3, para 2, at 29-2/[redacted] weeks gestation  who presents to [facility] complaining of uterine contractions,  most specifically she complains of some menstrual type pain lasting all day.  It is unclear why she did not contact the office with these concerns. The  patient denies any vagina bleeding. She denies any rupture of membranes. She  describes good fetal movement.  No change in her activity today.   PERTINENT REVIEW OF SYSTEMS:  GI: Negative.  GU: Negative.   PRENATAL COURSE:  Her entire prenatal record is reviewed. She has had a  previous visit too labor and delivery complaining of a vaginal discharge.  Previously this was similar in that she presented after hours for further  evaluation and had not contacted the office.   PAST MEDICAL HISTORY:  Otherwise unchanged.   FAMILY HISTORY:  Otherwise unchanged.   PAST SURGICAL HISTORY:  Otherwise unchanged. She has had two prior vaginal  deliveries without complications, both of these delivered at term.   PHYSICAL EXAMINATION:  GENERAL: In no acute distress.  VITAL SIGNS: Temperature 97.9, pulse 99, respirations 20, blood pressure  117/63.  HEENT: Negative. No adenopathy.  NECK: Supple. Thyroid not palpable.  LUNGS: Clear.  CARDIOVASCULAR: Regular rate and rhythm.  ABDOMEN: Soft and nontender. No surgical scars are identified. She has  vertex presentation by Leopold's maneuvers. Fundal height is 32 cm. She is  noted to be obese.  EXTREMITIES: Normal.  PELVIC EXAM: Normal external  genitalia. No lesions or ulcerations  identified. No leakage of fluid. No vaginal bleeding. No lesions are noted.  Pelvic examination reveals the cervix to be closed posterior, about 3 cm  long. External fetal monitor reveals two uterine contractions in a 15 minute  time period.   A nonstress test was performed with the indication of preterm labor.  Nonstress test is reactive with baseline in the 150s, accelerations are  noted. No fetal heart rate decelerations are noted. Reactive nonstress test.   ASSESSMENT:  A 29-2/7 weeks intrauterine pregnancy, threatened preterm  labor.   HOSPITAL COURSE:  The patient is treated with 0.25 mg of subcu terbutaline.  In addition she has received 2 mg of IM morphine, 25 mg of IM Phenergan for  therapeutic rest. The patient had sensation of uterine activity with this  regimen. Additionally, she had no leakage of fluid, and no vaginal bleeding.  She was thus discharged to home on the  same date of service. Signs and  symptoms of preterm labor reviewed with the patient. She is recommended to  continue prenatal care as per previous.     ___________________________________________                                         Langley Gauss, M.D.   DC/MEDQ  D:  03/21/2004  T:  03/21/2004  Job:  [id number]

## 2011-03-25 NOTE — Op Note (Signed)
NAME:  April Boyer, April Boyer                        ACCOUNT NO.:  [id number]   MEDICAL RECORD NO.:  [id number]                   PATIENT TYPE:  INP   LOCATION:  A418                                 FACILITY:  [facility]   PHYSICIAN:  Lazaro Arms, M.D.                DATE OF BIRTH:  Jan 04, 1981   DATE OF PROCEDURE:  DATE OF DISCHARGE:  05/07/2004                                 OPERATIVE REPORT   DELIVERY NOTE   This patient is an 30 year old, gravida 3 para 2 at 75 and 6/[redacted] weeks  gestation who presented to labor and delivery complaining of irregular  uterine contractions which progressed very quickly.  She delivered over an  intact perineum at 0804 a viable female over a first degree laceration which  did not require repair.  The Apgars of the infant were 9 and 9.  There was a  3-vessel cord.  Cord blood and cord gas were sent.  The placenta was normal  and intact, delivered at 0809.  The infant did well and underwent routine  neonatal resuscitation.  The uterus was firm, responded to Pitocin and below  the umbilicus.  Blood loss for the delivery was 250 cc.  The patient will  undergo routine postpartum care.  She is Rh positive.      ___________________________________________                                            Lazaro Arms, M.D.   LHE/MEDQ  D:  05/25/2004  T:  05/25/2004  Job:  [id number]

## 2011-03-25 NOTE — Discharge Summary (Signed)
NAME:  April Boyer, [name]                        ACCOUNT NO.:  [id number]   MEDICAL RECORD NO.:  [id number]                   PATIENT TYPE:  INP   LOCATION:  A428                                 FACILITY:  [facility]   PHYSICIAN:  Langley Gauss, M.D.                DATE OF BIRTH:  [birth year]   DATE OF ADMISSION:  12/13/2002  DATE OF DISCHARGE:  12/15/2002                                 DISCHARGE SUMMARY   DIAGNOSES:  1. Thirty nine plus week intrauterine pregnancy in labor.  2. Prenatal care in [place] and presented to [facility] as an OB     on the side.   PROCEDURE:  Delivery performed on December 14, 2002, spontaneous assisted  vaginal delivery of a 7 pound 7 ounce female infant delivered over an intact  perineum with an estimated blood loss of less than 500 cc.   PERTINENT LABORATORY STUDIES:  A positive blood type. Admission hemoglobin  and hematocrit were 9.5 and 29.4 with an MCV of 73.0.   DISPOSITION:  The patient is given a copy of the standardized discharge  instructions at the time of discharge.   FOLLOW UP:  She will follow-up with the physician of her choice in four to  six weeks time at which time she is interested in discussing birth control  options.   HOSPITAL COURSE:  The patient presented to [facility] on the p.m.  of December 13, 2002 at 39 plus weeks gestation having received prenatal care  through the office of Dr. Orvan Falconer at the [facility] in [place],  [place].  The patient was visiting here in [place] when she  spontaneously went into labor.  The patient has been complaining of  contractions irregularly throughout the day.  When she presented to [facility] she was noted to be having very irregular mild uterine  contractions with a reassuring fetal heart rate.  However, examination of  her cervix revealed her to be 5 cm dilated with a vertex presentation and  70% effaced.  Thus, she was admitted in the early stages of labor.   Due to  her unknown Group B Strep status she was treated with ampicillin 2 grams IV  followed by 1 gram IV q.4.h during the course of labor.  The patient had a  reassuring fetal heart rate throughout the course of labor.  She did  initially state an interest in epidural analgesia; however, the patient  progressed very rapidly along the labor curve.  She received only IV  narcotics during the course of labor.  She progressed to complete  dilatation and had an uncomplicated vaginal delivery.  Postpartum the  patient did very well.  She bonded well with the infant and had no  postpartum complications; specifically, no excessive bleeding or uterine  atony.  Thus, the patient was discharged to home on postpartum day number  on, December 15, 2002.  Langley Gauss, M.D.    DC/MEDQ  D:  12/17/2002  T:  12/17/2002  Job:  [id number]   cc:   Cleophus Molt  [address] [place]  [place] [address]  Fax: [phone]

## 2011-04-10 ENCOUNTER — Emergency Department (HOSPITAL_COMMUNITY)
Admission: EM | Admit: 2011-04-10 | Discharge: 2011-04-10 | Disposition: A | Discharge status: Home / Self Care | Payer: BC Managed Care – PPO | Attending: Emergency Medicine | Admitting: Emergency Medicine

## 2011-04-10 DIAGNOSIS — K089 Disorder of teeth and supporting structures, unspecified: Secondary | ICD-10-CM | POA: Insufficient documentation

## 2011-04-10 DIAGNOSIS — K029 Dental caries, unspecified: Secondary | ICD-10-CM | POA: Insufficient documentation

## 2011-05-15 DIAGNOSIS — R109 Unspecified abdominal pain: Secondary | ICD-10-CM | POA: Insufficient documentation

## 2011-05-15 DIAGNOSIS — R112 Nausea with vomiting, unspecified: Secondary | ICD-10-CM | POA: Insufficient documentation

## 2011-05-16 ENCOUNTER — Encounter (HOSPITAL_COMMUNITY): Payer: Self-pay | Admitting: Emergency Medicine

## 2011-05-16 ENCOUNTER — Emergency Department (HOSPITAL_COMMUNITY): Payer: BC Managed Care – PPO

## 2011-05-16 ENCOUNTER — Encounter: Payer: Self-pay | Admitting: *Deleted

## 2011-05-16 ENCOUNTER — Emergency Department (HOSPITAL_COMMUNITY)
Admission: EM | Admit: 2011-05-16 | Discharge: 2011-05-16 | Discharge status: Against Medical Advice | Payer: BC Managed Care – PPO | Attending: Emergency Medicine | Admitting: Emergency Medicine

## 2011-05-16 ENCOUNTER — Emergency Department (HOSPITAL_COMMUNITY)
Admission: EM | Admit: 2011-05-16 | Discharge: 2011-05-16 | Disposition: A | Discharge status: Home / Self Care | Payer: BC Managed Care – PPO | Attending: Emergency Medicine | Admitting: Emergency Medicine

## 2011-05-16 DIAGNOSIS — F17200 Nicotine dependence, unspecified, uncomplicated: Secondary | ICD-10-CM | POA: Insufficient documentation

## 2011-05-16 DIAGNOSIS — R1031 Right lower quadrant pain: Secondary | ICD-10-CM | POA: Insufficient documentation

## 2011-05-16 DIAGNOSIS — R109 Unspecified abdominal pain: Secondary | ICD-10-CM

## 2011-05-16 HISTORY — DX: Gastro-esophageal reflux disease without esophagitis: K21.9

## 2011-05-16 LAB — COMPREHENSIVE METABOLIC PANEL
ALT: 8 U/L (ref 0–35)
AST: 11 U/L (ref 0–37)
Alkaline Phosphatase: 78 U/L (ref 39–117)
CO2: 28 mEq/L (ref 19–32)
Calcium: 9.9 mg/dL (ref 8.4–10.5)
Chloride: 101 mEq/L (ref 96–112)
GFR calc Af Amer: >60 mL/min (ref >60)
GFR calc non Af Amer: >60 mL/min (ref >60)
Glucose, Bld: 97 mg/dL (ref 70–99)
Potassium: 3.4 mEq/L — ABNORMAL LOW (ref 3.5–5.1)
Sodium: 138 mEq/L (ref 135–145)

## 2011-05-16 LAB — URINALYSIS, ROUTINE W REFLEX MICROSCOPIC
Bilirubin Urine: NEGATIVE
Glucose, UA: NEGATIVE mg/dL
Hgb urine dipstick: NEGATIVE
Specific Gravity, Urine: 1.025 (ref 1.005–1.030)
Urobilinogen, UA: 0.2 mg/dL (ref 0.0–1.0)

## 2011-05-16 LAB — CBC
MCV: 83.6 fL (ref 78.0–100.0)
Platelets: 273 10*3/uL (ref 150–400)
RBC: 4.56 MIL/uL (ref 3.87–5.11)
RDW: 17.2 % — ABNORMAL HIGH (ref 11.5–15.5)
WBC: 12.8 10*3/uL — ABNORMAL HIGH (ref 4.0–10.5)

## 2011-05-16 LAB — DIFFERENTIAL
Basophils Absolute: 0 10*3/uL (ref 0.0–0.1)
Eosinophils Relative: 1 % (ref 0–5)
Lymphocytes Relative: 29 % (ref 12–46)
Lymphs Abs: 3.7 10*3/uL (ref 0.7–4.0)
Neutro Abs: 8.4 10*3/uL — ABNORMAL HIGH (ref 1.7–7.7)
Neutrophils Relative %: 65 % (ref 43–77)

## 2011-05-16 LAB — URINE MICROSCOPIC-ADD ON

## 2011-05-16 MED ORDER — HYDROCODONE-ACETAMINOPHEN 5-325 MG PO TABS: 1.0000 | ORAL_TABLET | ORAL | Status: AC | PRN

## 2011-05-16 MED ORDER — MORPHINE SULFATE 4 MG/ML IJ SOLN
4.0000 mg | Freq: Once | INTRAMUSCULAR | Status: AC
  Administered 2011-05-16: 4 mg via INTRAVENOUS
  Filled 2011-05-16: qty 1

## 2011-05-16 MED ORDER — ONDANSETRON HCL 4 MG PO TABS: 4.0000 mg | ORAL_TABLET | Freq: Four times a day (QID) | ORAL | Status: AC

## 2011-05-16 MED ORDER — IOHEXOL 300 MG/ML  SOLN
100.0000 mL | Freq: Once | INTRAMUSCULAR | Status: AC | PRN
  Administered 2011-05-16: 100 mL via INTRAVENOUS

## 2011-05-16 MED ORDER — SODIUM CHLORIDE 0.9 % IV SOLN
Freq: Once | INTRAVENOUS | Status: AC
  Administered 2011-05-16: 03:00:00 via INTRAVENOUS

## 2011-05-16 MED ORDER — ONDANSETRON HCL 4 MG/2ML IJ SOLN
4.0000 mg | Freq: Once | INTRAMUSCULAR | Status: AC
  Administered 2011-05-16: 4 mg via INTRAMUSCULAR
  Filled 2011-05-16: qty 2

## 2011-05-16 NOTE — ED Notes (Signed)
Patient with rt flank pain for 2 days, N/V, denies diarrhea

## 2011-05-16 NOTE — ED Notes (Signed)
Patient vomiting; Dr. Adriana Simas aware and order received for Zofran.

## 2011-05-16 NOTE — ED Notes (Signed)
Patient c/o RLQ pain x 2 days with associated vomiting.  Patient denies any fever.  Patient with rebound tenderness noted to RLQ.

## 2011-05-16 NOTE — ED Notes (Signed)
Patient left before being seen and called EMS to be brought in.

## 2011-05-16 NOTE — ED Notes (Signed)
Patient resting comfortably in bed; lights dimmed.  Patient awaiting disposition.

## 2011-05-16 NOTE — ED Notes (Signed)
Patient left before being placed into a room.  Patient was going to [place] and called EMS to be brought back in.  Patient placed in hall bed.

## 2011-05-16 NOTE — ED Notes (Signed)
Patient ambulatory to waiting room to speak with family members.

## 2011-06-16 NOTE — ED Provider Notes (Signed)
History     CSN: [id number] Arrival date & time: 05/16/2011  1:16 AM  Chief Complaint  Patient presents with  . Abdominal Pain   Patient is a 30 y.o. female presenting with abdominal pain. The history is provided by the patient.  Abdominal Pain The primary symptoms of the illness include abdominal pain. The current episode started 2 days ago. The problem has been gradually worsening.  Associated with: nothing. The patient has not had a change in bowel habit. Associated symptoms comments: n and v.  feels dehydrated;  No vag d/c;  LMP 04/30/11  Past Medical History  Diagnosis Date  . Acid reflux     Chronic back pain    Past Surgical History  Procedure Date  . Foot surgery   . Tubal ligation     No family history on file.  History  Substance Use Topics  . Smoking status: Current Everyday Smoker -- 1.0 packs/day  . Smokeless tobacco: Not on file  . Alcohol Use: No    OB History    Grav Para Term Preterm Abortions TAB SAB Ect Mult Living                  Review of Systems  Gastrointestinal: Positive for abdominal pain.  All other systems reviewed and are negative.    Physical Exam  BP 128/88  Pulse 96  Temp(Src) 97.5 F (36.4 C) (Oral)  Resp 20  SpO2 100%  LMP 04/30/2011  Physical Exam  Nursing note and vitals reviewed. Constitutional: She is oriented to person, place, and time. She appears well-developed and well-nourished.  HENT:  Head: Normocephalic and atraumatic.  Eyes: Conjunctivae and EOM are normal. Pupils are equal, round, and reactive to light.  Neck: Normal range of motion. Neck supple.  Cardiovascular: Normal rate and regular rhythm.   Pulmonary/Chest: Effort normal and breath sounds normal.  Abdominal: Soft. Bowel sounds are normal.       Min tenderness RLQ  Musculoskeletal: Normal range of motion.  Neurological: She is alert and oriented to person, place, and time.  Skin: Skin is warm and dry.  Psychiatric: She has a normal mood and  affect.    ED Course  Procedures  MDM:  Ct scan shows no acute appy.  VS improved c hydration.  No acute abd   Results for orders placed during the hospital encounter of 05/16/11  URINALYSIS, ROUTINE W REFLEX MICROSCOPIC      Component Value Range   Color, Urine YELLOW  YELLOW    Appearance CLEAR  CLEAR    Specific Gravity, Urine 1.025  1.005 - 1.030    pH 6.5  5.0 - 8.0    Glucose, UA NEGATIVE  NEGATIVE (mg/dL)   Hgb urine dipstick NEGATIVE  NEGATIVE    Bilirubin Urine NEGATIVE  NEGATIVE    Ketones, ur NEGATIVE  NEGATIVE (mg/dL)   Protein, ur NEGATIVE  NEGATIVE (mg/dL)   Urobilinogen, UA 0.2  0.0 - 1.0 (mg/dL)   Nitrite NEGATIVE  NEGATIVE    Leukocytes, UA TRACE (*) NEGATIVE   URINE MICROSCOPIC-ADD ON      Component Value Range   Squamous Epithelial / LPF MANY (*) RARE    WBC, UA 3-6  <3 (WBC/hpf)   RBC / HPF 0-2  <3 (RBC/hpf)   Bacteria, UA FEW (*) RARE   CBC      Component Value Range   WBC 12.8 (*) 4.0 - 10.5 (K/uL)   RBC 4.56  3.87 - 5.11 (MIL/uL)  Hemoglobin 12.2  12.0 - 15.0 (g/dL)   HCT 16.1  09.6 - 04.5 (%)   MCV 83.6  78.0 - 100.0 (fL)   MCH 26.8  26.0 - 34.0 (pg)   MCHC 32.0  30.0 - 36.0 (g/dL)   RDW 40.9 (*) 81.1 - 15.5 (%)   Platelets 273  150 - 400 (K/uL)  DIFFERENTIAL      Component Value Range   Neutrophils Relative 65  43 - 77 (%)   Neutro Abs 8.4 (*) 1.7 - 7.7 (K/uL)   Lymphocytes Relative 29  12 - 46 (%)   Lymphs Abs 3.7  0.7 - 4.0 (K/uL)   Monocytes Relative 4  3 - 12 (%)   Monocytes Absolute 0.5  0.1 - 1.0 (K/uL)   Eosinophils Relative 1  0 - 5 (%)   Eosinophils Absolute 0.2  0.0 - 0.7 (K/uL)   Basophils Relative 0  0 - 1 (%)   Basophils Absolute 0.0  0.0 - 0.1 (K/uL)  COMPREHENSIVE METABOLIC PANEL      Component Value Range   Sodium 138  135 - 145 (mEq/L)   Potassium 3.4 (*) 3.5 - 5.1 (mEq/L)   Chloride 101  96 - 112 (mEq/L)   CO2 28  19 - 32 (mEq/L)   Glucose, Bld 97  70 - 99 (mg/dL)   BUN 10  6 - 23 (mg/dL)   Creatinine, Ser 9.14   0.50 - 1.10 (mg/dL)   Calcium 9.9  8.4 - 78.2 (mg/dL)   Total Protein 7.7  6.0 - 8.3 (g/dL)   Albumin 3.6  3.5 - 5.2 (g/dL)   AST 11  0 - 37 (U/L)   ALT 8  0 - 35 (U/L)   Alkaline Phosphatase 78  39 - 117 (U/L)   Total Bilirubin 0.2 (*) 0.3 - 1.2 (mg/dL)   GFR calc non Af Amer >60  >60 (mL/min)   GFR calc Af Amer >60  >60 (mL/min)  PREGNANCY, URINE      Component Value Range   Preg Test, Ur NEGATIVE     No results found.   Donnetta Hutching, MD 06/16/11 [phone]

## 2011-07-29 LAB — URINALYSIS, ROUTINE W REFLEX MICROSCOPIC
Glucose, UA: NEGATIVE
Nitrite: NEGATIVE
Specific Gravity, Urine: >1.03 — ABNORMAL HIGH
pH: 5.5

## 2011-07-29 LAB — BASIC METABOLIC PANEL
Chloride: 107
Creatinine, Ser: 0.75
GFR calc Af Amer: >60
GFR calc non Af Amer: >60

## 2011-07-29 LAB — DIFFERENTIAL
Eosinophils Absolute: 0.2
Lymphocytes Relative: 31
Lymphs Abs: 2.8
Monocytes Relative: 4
Neutro Abs: 5.6
Neutrophils Relative %: 62

## 2011-07-29 LAB — URINE MICROSCOPIC-ADD ON

## 2011-07-29 LAB — PREGNANCY, URINE: Preg Test, Ur: NEGATIVE

## 2011-07-29 LAB — CBC
MCV: 82.1
RBC: 4.64
WBC: 9.1

## 2011-08-01 LAB — PREGNANCY, URINE: Preg Test, Ur: NEGATIVE

## 2011-08-01 LAB — URINALYSIS, ROUTINE W REFLEX MICROSCOPIC
Bilirubin Urine: NEGATIVE
Hgb urine dipstick: NEGATIVE
Ketones, ur: NEGATIVE
Nitrite: NEGATIVE
Urobilinogen, UA: 0.2

## 2011-08-01 LAB — URINE MICROSCOPIC-ADD ON

## 2011-08-12 LAB — URINE MICROSCOPIC-ADD ON

## 2011-08-12 LAB — URINALYSIS, ROUTINE W REFLEX MICROSCOPIC
Glucose, UA: NEGATIVE
Specific Gravity, Urine: >1.03 — ABNORMAL HIGH
pH: 5.5

## 2011-08-12 LAB — BASIC METABOLIC PANEL
CO2: 24
Calcium: 8.8
GFR calc Af Amer: >60
GFR calc non Af Amer: >60
Glucose, Bld: 93
Potassium: 3.5
Sodium: 139

## 2011-08-12 LAB — CBC
HCT: 37.5
Hemoglobin: 12.1
RBC: 4.61
RDW: 18.4 — ABNORMAL HIGH

## 2011-08-12 LAB — DIFFERENTIAL
Basophils Absolute: 0
Eosinophils Relative: 1
Lymphocytes Relative: 29
Monocytes Absolute: 0.4
Monocytes Relative: 5
Neutro Abs: 6.2

## 2011-08-18 LAB — URINALYSIS, ROUTINE W REFLEX MICROSCOPIC
Bilirubin Urine: NEGATIVE
Ketones, ur: NEGATIVE
Nitrite: NEGATIVE
Protein, ur: NEGATIVE
Urobilinogen, UA: 1
pH: 6

## 2011-08-18 LAB — URINE MICROSCOPIC-ADD ON

## 2011-08-18 LAB — PREGNANCY, URINE: Preg Test, Ur: NEGATIVE

## 2011-08-23 LAB — URINE MICROSCOPIC-ADD ON

## 2011-08-23 LAB — URINALYSIS, ROUTINE W REFLEX MICROSCOPIC
Glucose, UA: NEGATIVE
Protein, ur: NEGATIVE
Specific Gravity, Urine: 1.025

## 2011-09-10 ENCOUNTER — Encounter (HOSPITAL_COMMUNITY): Payer: Self-pay

## 2011-09-10 ENCOUNTER — Emergency Department (HOSPITAL_COMMUNITY): Payer: BC Managed Care – PPO

## 2011-09-10 ENCOUNTER — Emergency Department (HOSPITAL_COMMUNITY)
Admission: EM | Admit: 2011-09-10 | Discharge: 2011-09-11 | Disposition: A | Discharge status: Home / Self Care | Payer: BC Managed Care – PPO | Attending: Emergency Medicine | Admitting: Emergency Medicine

## 2011-09-10 DIAGNOSIS — R1031 Right lower quadrant pain: Secondary | ICD-10-CM | POA: Insufficient documentation

## 2011-09-10 DIAGNOSIS — K219 Gastro-esophageal reflux disease without esophagitis: Secondary | ICD-10-CM | POA: Insufficient documentation

## 2011-09-10 DIAGNOSIS — N390 Urinary tract infection, site not specified: Secondary | ICD-10-CM

## 2011-09-10 DIAGNOSIS — G8929 Other chronic pain: Secondary | ICD-10-CM | POA: Insufficient documentation

## 2011-09-10 DIAGNOSIS — F17200 Nicotine dependence, unspecified, uncomplicated: Secondary | ICD-10-CM | POA: Insufficient documentation

## 2011-09-10 DIAGNOSIS — M549 Dorsalgia, unspecified: Secondary | ICD-10-CM | POA: Insufficient documentation

## 2011-09-10 HISTORY — DX: Dorsalgia, unspecified: M54.9

## 2011-09-10 HISTORY — DX: Other chronic pain: G89.29

## 2011-09-10 LAB — COMPREHENSIVE METABOLIC PANEL
AST: 77 U/L — ABNORMAL HIGH (ref 0–37)
Alkaline Phosphatase: 76 U/L (ref 39–117)
BUN: 12 mg/dL (ref 6–23)
CO2: 28 mEq/L (ref 19–32)
Chloride: 101 mEq/L (ref 96–112)
Creatinine, Ser: 0.67 mg/dL (ref 0.50–1.10)
GFR calc non Af Amer: >90 mL/min (ref >90)
Potassium: 3.8 mEq/L (ref 3.5–5.1)
Total Bilirubin: 0.1 mg/dL — ABNORMAL LOW (ref 0.3–1.2)

## 2011-09-10 LAB — DIFFERENTIAL
Basophils Absolute: 0 10*3/uL (ref 0.0–0.1)
Basophils Relative: 0 % (ref 0–1)
Eosinophils Relative: 1 % (ref 0–5)
Lymphocytes Relative: 43 % (ref 12–46)
Monocytes Absolute: 0.5 10*3/uL (ref 0.1–1.0)

## 2011-09-10 LAB — URINALYSIS, ROUTINE W REFLEX MICROSCOPIC
Bilirubin Urine: NEGATIVE
Glucose, UA: NEGATIVE mg/dL
Ketones, ur: NEGATIVE mg/dL
Protein, ur: NEGATIVE mg/dL
pH: 6 (ref 5.0–8.0)

## 2011-09-10 LAB — LIPASE, BLOOD: Lipase: 26 U/L (ref 11–59)

## 2011-09-10 LAB — CBC
MCHC: 31.8 g/dL (ref 30.0–36.0)
MCV: 85.5 fL (ref 78.0–100.0)
Platelets: 290 10*3/uL (ref 150–400)
RDW: 17.9 % — ABNORMAL HIGH (ref 11.5–15.5)
WBC: 7.9 10*3/uL (ref 4.0–10.5)

## 2011-09-10 LAB — POCT PREGNANCY, URINE: Preg Test, Ur: NEGATIVE

## 2011-09-10 LAB — URINE MICROSCOPIC-ADD ON

## 2011-09-10 MED ORDER — MORPHINE SULFATE 2 MG/ML IJ SOLN
2.0000 mg | INTRAMUSCULAR | Status: DC | PRN
  Administered 2011-09-10: 2 mg via INTRAVENOUS
  Filled 2011-09-10 (×2): qty 1

## 2011-09-10 MED ORDER — DEXTROSE 5 % IV SOLN
1.0000 g | Freq: Once | INTRAVENOUS | Status: AC
  Administered 2011-09-10: 1 g via INTRAVENOUS
  Filled 2011-09-10: qty 10

## 2011-09-10 MED ORDER — OXYCODONE HCL 5 MG PO TABS: 5.0000 mg | ORAL_TABLET | ORAL | Status: AC | PRN

## 2011-09-10 MED ORDER — SODIUM CHLORIDE 0.9 % IV SOLN
INTRAVENOUS | Status: DC
  Administered 2011-09-10: 21:00:00 via INTRAVENOUS

## 2011-09-10 MED ORDER — IOHEXOL 300 MG/ML  SOLN
100.0000 mL | Freq: Once | INTRAMUSCULAR | Status: AC | PRN
  Administered 2011-09-10: 100 mL via INTRAVENOUS

## 2011-09-10 MED ORDER — MORPHINE SULFATE 2 MG/ML IJ SOLN
2.0000 mg | Freq: Once | INTRAMUSCULAR | Status: AC
  Administered 2011-09-10: 2 mg via INTRAVENOUS

## 2011-09-10 MED ORDER — PROMETHAZINE HCL 25 MG PO TABS: 25.0000 mg | ORAL_TABLET | Freq: Four times a day (QID) | ORAL | Status: DC | PRN

## 2011-09-10 MED ORDER — ONDANSETRON HCL 4 MG/2ML IJ SOLN
4.0000 mg | INTRAMUSCULAR | Status: DC | PRN
  Administered 2011-09-10: 4 mg via INTRAVENOUS
  Filled 2011-09-10: qty 2

## 2011-09-10 MED ORDER — CEPHALEXIN 500 MG PO CAPS: 500.0000 mg | ORAL_CAPSULE | Freq: Four times a day (QID) | ORAL | Status: AC

## 2011-09-10 NOTE — ED Provider Notes (Signed)
The patient's CT scan was negative. I reviewed the patient's laboratory studies and given the absence of concern for a pelvic etiology of her symptoms do not feel the elevated liver enzymes are secondary to [employer] syndrome. She has a history of chronic pain for which she's been taking 2 g of Tylenol every 4 hours. I feel this is the likely etiology of her slight elevation of liver enzymes. Given the elevation of the liver enzymes and a known mild overdose of Tylenol I obtained an acetaminophen level which was normal. I will discharge her home with instructions to refrain from additional Tylenol. She'll be discharged home with an antibiotic to treat her urinary tract infection instructions to follow up with her primary care physician this week  Dayton Bailiff, MD 09/10/11 2343

## 2011-09-10 NOTE — ED Notes (Signed)
Pt presents with RLQ abdominal pain, vomiting, and fever. Pt states pain started around 1700 yesterday. No active vomiting at this time.

## 2011-09-10 NOTE — ED Provider Notes (Signed)
History     CSN: [id number] Arrival date & time: 09/10/2011  8:46 PM     Chief Complaint  Patient presents with  . Abdominal Pain  . Emesis  . Fever   HPI Pt was seen at 2045.  Per pt, c/o gradual onset and worsening of constant RLQ abd "pain" since yesterday evening.  Has been assoc with multiple intermittent episodes of N/V and home fevers to "102."  Denies diarrhea, no back pain, no dysuria, no vaginal discharge, no rash, no CP/SOB, no cough.     Past Medical History  Diagnosis Date  . Acid reflux   . Chronic back pain     Past Surgical History  Procedure Date  . Foot surgery   . Tubal ligation    Social History  . Marital Status: Married   Social History Main Topics  . Smoking status: Current Everyday Smoker -- 1.0 packs/day  . Smokeless tobacco: None  . Alcohol Use: No  . Drug Use: No    Review of Systems ROS: Statement: All systems negative except as marked or noted in the HPI; Constitutional: Positive for fever and chills. ; ; Eyes: Negative for eye pain, redness and discharge. ; ; ENMT: Negative for ear pain, hoarseness, nasal congestion, sinus pressure and sore throat. ; ; Cardiovascular: Negative for chest pain, palpitations, diaphoresis, dyspnea and peripheral edema. ; ; Respiratory: Negative for cough, wheezing and stridor. ; ; Gastrointestinal: Positive for nausea, vomiting, and abdominal pain. Negative for diarrhea, blood in stool, hematemesis, jaundice and rectal bleeding. . ; ; Genitourinary: Negative for dysuria, flank pain and hematuria.  GYN: No vaginal discharge, no vulvar pain.; Musculoskeletal: Negative for back pain and neck pain. Negative for swelling and trauma.; ; Skin: Negative for pruritus, rash, abrasions, blisters, bruising and skin lesion.; ; Neuro: Negative for headache, lightheadedness and neck stiffness. Negative for weakness, altered level of consciousness , altered mental status, extremity weakness, paresthesias, involuntary movement, seizure  and syncope.     Allergies  Review of patient's allergies indicates no known allergies.  Home Medications  No current outpatient prescriptions on file.  BP 119/67  Pulse 102  Temp(Src) 98.2 F (36.8 C) (Oral)  Resp 20  Ht 5\' 7"  (1.702 m)  Wt 200 lb (90.719 kg)  BMI 31.32 kg/m2  SpO2 100%  LMP 09/02/2011  Physical Exam 2050: Physical examination:  Nursing notes reviewed; Vital signs and O2 SAT reviewed;  Constitutional: Well developed, Well nourished, Well hydrated, Uncomfortable;. Head:  Normocephalic, atraumatic; Eyes: EOMI, PERRL, No scleral icterus; ENMT: Mouth and pharynx normal, Mucous membranes moist; Neck: Supple, Full range of motion, No lymphadenopathy; Cardiovascular: Regular rate and rhythm, No murmur, rub, or gallop; Respiratory: Breath sounds clear & equal bilaterally, No rales, rhonchi, wheezes, or rub, Normal respiratory effort/excursion; Chest: Nontender, Movement normal; Abdomen: Soft, +TTP RLQ. Nondistended, Normal bowel sounds; Genitourinary: No CVA tenderness; Extremities: Pulses normal, No tenderness, No edema, No calf edema or asymmetry.; Neuro: AA&Ox3, Major CN grossly intact.  No gross focal motor or sensory deficits in extremities.; Skin: Color normal, Warm, Dry, no rash.   ED Course  Procedures   MDM  MDM Reviewed: nursing note and vitals Interpretation: labs and CT scan   Results for orders placed during the hospital encounter of 09/10/11  URINALYSIS, ROUTINE W REFLEX MICROSCOPIC      Component Value Range   Color, Urine YELLOW  YELLOW    Appearance CLOUDY (*) CLEAR    Specific Gravity, Urine >1.030 (*) 1.005 - 1.030  pH 6.0  5.0 - 8.0    Glucose, UA NEGATIVE  NEGATIVE (mg/dL)   Hgb urine dipstick NEGATIVE  NEGATIVE    Bilirubin Urine NEGATIVE  NEGATIVE    Ketones, ur NEGATIVE  NEGATIVE (mg/dL)   Protein, ur NEGATIVE  NEGATIVE (mg/dL)   Urobilinogen, UA 0.2  0.0 - 1.0 (mg/dL)   Nitrite NEGATIVE  NEGATIVE    Leukocytes, UA TRACE (*)  NEGATIVE   COMPREHENSIVE METABOLIC PANEL      Component Value Range   Sodium 136  135 - 145 (mEq/L)   Potassium 3.8  3.5 - 5.1 (mEq/L)   Chloride 101  96 - 112 (mEq/L)   CO2 28  19 - 32 (mEq/L)   Glucose, Bld 99  70 - 99 (mg/dL)   BUN 12  6 - 23 (mg/dL)   Creatinine, Ser 1.61  0.50 - 1.10 (mg/dL)   Calcium 9.3  8.4 - 09.6 (mg/dL)   Total Protein 6.6  6.0 - 8.3 (g/dL)   Albumin 3.4 (*) 3.5 - 5.2 (g/dL)   AST 77 (*) 0 - 37 (U/L)   ALT 117 (*) 0 - 35 (U/L)   Alkaline Phosphatase 76  39 - 117 (U/L)   Total Bilirubin 0.1 (*) 0.3 - 1.2 (mg/dL)   GFR calc non Af Amer >90  >90 (mL/min)   GFR calc Af Amer >90  >90 (mL/min)  LIPASE, BLOOD      Component Value Range   Lipase 26  11 - 59 (U/L)  CBC      Component Value Range   WBC 7.9  4.0 - 10.5 (K/uL)   RBC 4.20  3.87 - 5.11 (MIL/uL)   Hemoglobin 11.4 (*) 12.0 - 15.0 (g/dL)   HCT 04.5 (*) 40.9 - 46.0 (%)   MCV 85.5  78.0 - 100.0 (fL)   MCH 27.1  26.0 - 34.0 (pg)   MCHC 31.8  30.0 - 36.0 (g/dL)   RDW 81.1 (*) 91.4 - 15.5 (%)   Platelets 290  150 - 400 (K/uL)  DIFFERENTIAL      Component Value Range   Neutrophils Relative 50  43 - 77 (%)   Neutro Abs 3.9  1.7 - 7.7 (K/uL)   Lymphocytes Relative 43  12 - 46 (%)   Lymphs Abs 3.4  0.7 - 4.0 (K/uL)   Monocytes Relative 6  3 - 12 (%)   Monocytes Absolute 0.5  0.1 - 1.0 (K/uL)   Eosinophils Relative 1  0 - 5 (%)   Eosinophils Absolute 0.1  0.0 - 0.7 (K/uL)   Basophils Relative 0  0 - 1 (%)   Basophils Absolute 0.0  0.0 - 0.1 (K/uL)  POCT PREGNANCY, URINE      Component Value Range   Preg Test, Ur NEGATIVE    URINE MICROSCOPIC-ADD ON      Component Value Range   Squamous Epithelial / LPF MANY (*) RARE    WBC, UA 11-20  <3 (WBC/hpf)   Bacteria, UA MANY (*) RARE     Results for KARLIAH, KOWALCHUK (MRN [id number]) as of 09/10/2011 21:43  Ref. Range 02/12/2011 01:05 05/16/2011 03:18 09/10/2011 20:50  AST Latest Range: 0-37 U/L 12 11 77 (H)  ALT Latest Range: 0-35 U/L 21 8 117 (H)    Results for NEKA, BISE (MRN [id number]) as of 09/10/2011 21:43  Ref. Range 11/02/2007 20:00 12/30/2007 20:29 02/12/2011 01:05 05/16/2011 03:18 09/10/2011 20:50  HGB Latest Range: 12.0-15.0 g/dL 62.9 52.8 41.3 (L) 24.4 11.4 (L)  HCT Latest Range: 36.0-46.0 % 37.5 38.0 36.7 38.1 35.9 (L)    9:55 PM:  +UTI, will dose IV rocephin while in the ED.  LFT's mildly elevated compared to previous.  H/H appears per baseline.  Echart review reveals that pt has had multiple ED visits for these same complaints dating back to 2006 with multiple CT A/P negative for acute process.  CT A/P pending today, sign out to Dr. Brooke Dare.    Ysabella Babiarz Allison Quarry, DO 09/10/11 2156

## 2011-10-01 ENCOUNTER — Emergency Department (HOSPITAL_COMMUNITY)
Admission: EM | Admit: 2011-10-01 | Discharge: 2011-10-01 | Discharge status: Against Medical Advice | Payer: BC Managed Care – PPO | Attending: Emergency Medicine | Admitting: Emergency Medicine

## 2011-10-01 ENCOUNTER — Encounter (HOSPITAL_COMMUNITY): Payer: Self-pay | Admitting: *Deleted

## 2011-10-01 DIAGNOSIS — R05 Cough: Secondary | ICD-10-CM | POA: Insufficient documentation

## 2011-10-01 DIAGNOSIS — N898 Other specified noninflammatory disorders of vagina: Secondary | ICD-10-CM | POA: Insufficient documentation

## 2011-10-01 DIAGNOSIS — R109 Unspecified abdominal pain: Secondary | ICD-10-CM | POA: Insufficient documentation

## 2011-10-01 DIAGNOSIS — R059 Cough, unspecified: Secondary | ICD-10-CM | POA: Insufficient documentation

## 2011-10-01 NOTE — ED Notes (Addendum)
Pt c/o cough x2 weeks. Pt states she has also been spotting/vaginal bleeding for 2 days now. Pt states she is sore from cough. Pt also c/o abd pain.

## 2012-02-07 ENCOUNTER — Emergency Department (HOSPITAL_COMMUNITY)
Admission: EM | Admit: 2012-02-07 | Discharge: 2012-02-08 | Disposition: A | Discharge status: Home / Self Care | Payer: BC Managed Care – PPO | Attending: Emergency Medicine | Admitting: Emergency Medicine

## 2012-02-07 ENCOUNTER — Encounter (HOSPITAL_COMMUNITY): Payer: Self-pay | Admitting: Emergency Medicine

## 2012-02-07 DIAGNOSIS — R112 Nausea with vomiting, unspecified: Secondary | ICD-10-CM | POA: Insufficient documentation

## 2012-02-07 DIAGNOSIS — K219 Gastro-esophageal reflux disease without esophagitis: Secondary | ICD-10-CM | POA: Insufficient documentation

## 2012-02-07 DIAGNOSIS — N3090 Cystitis, unspecified without hematuria: Secondary | ICD-10-CM | POA: Insufficient documentation

## 2012-02-07 DIAGNOSIS — A599 Trichomoniasis, unspecified: Secondary | ICD-10-CM | POA: Insufficient documentation

## 2012-02-07 DIAGNOSIS — R109 Unspecified abdominal pain: Secondary | ICD-10-CM | POA: Insufficient documentation

## 2012-02-07 DIAGNOSIS — N390 Urinary tract infection, site not specified: Secondary | ICD-10-CM

## 2012-02-07 DIAGNOSIS — G8929 Other chronic pain: Secondary | ICD-10-CM | POA: Insufficient documentation

## 2012-02-07 DIAGNOSIS — R350 Frequency of micturition: Secondary | ICD-10-CM | POA: Insufficient documentation

## 2012-02-07 DIAGNOSIS — M549 Dorsalgia, unspecified: Secondary | ICD-10-CM | POA: Insufficient documentation

## 2012-02-07 LAB — URINE MICROSCOPIC-ADD ON

## 2012-02-07 LAB — PREGNANCY, URINE: Preg Test, Ur: NEGATIVE

## 2012-02-07 LAB — COMPREHENSIVE METABOLIC PANEL
ALT: 14 U/L (ref 0–35)
AST: 12 U/L (ref 0–37)
CO2: 25 mEq/L (ref 19–32)
Calcium: 8.9 mg/dL (ref 8.4–10.5)
Chloride: 102 mEq/L (ref 96–112)
Creatinine, Ser: 0.73 mg/dL (ref 0.50–1.10)
GFR calc Af Amer: >90 mL/min (ref >90)
GFR calc non Af Amer: >90 mL/min (ref >90)
Glucose, Bld: 112 mg/dL — ABNORMAL HIGH (ref 70–99)
Total Bilirubin: 0.2 mg/dL — ABNORMAL LOW (ref 0.3–1.2)

## 2012-02-07 LAB — CBC
HCT: 31.5 % — ABNORMAL LOW (ref 36.0–46.0)
Hemoglobin: 9.5 g/dL — ABNORMAL LOW (ref 12.0–15.0)
MCV: 77.8 fL — ABNORMAL LOW (ref 78.0–100.0)
RBC: 4.05 MIL/uL (ref 3.87–5.11)
RDW: 16.8 % — ABNORMAL HIGH (ref 11.5–15.5)
WBC: 7.5 10*3/uL (ref 4.0–10.5)

## 2012-02-07 LAB — URINALYSIS, ROUTINE W REFLEX MICROSCOPIC
Bilirubin Urine: NEGATIVE
Glucose, UA: NEGATIVE mg/dL
Hgb urine dipstick: NEGATIVE
Specific Gravity, Urine: 1.015 (ref 1.005–1.030)
Urobilinogen, UA: 0.2 mg/dL (ref 0.0–1.0)
pH: 5 (ref 5.0–8.0)

## 2012-02-07 LAB — DIFFERENTIAL
Basophils Absolute: 0 10*3/uL (ref 0.0–0.1)
Eosinophils Relative: 4 % (ref 0–5)
Lymphocytes Relative: 34 % (ref 12–46)
Lymphs Abs: 2.5 10*3/uL (ref 0.7–4.0)
Monocytes Absolute: 0.3 10*3/uL (ref 0.1–1.0)
Neutro Abs: 4.4 10*3/uL (ref 1.7–7.7)

## 2012-02-07 MED ORDER — METRONIDAZOLE 500 MG PO TABS
2000.0000 mg | ORAL_TABLET | Freq: Once | ORAL | Status: AC
  Administered 2012-02-08: 2000 mg via ORAL
  Filled 2012-02-07: qty 4

## 2012-02-07 MED ORDER — HYDROMORPHONE HCL PF 1 MG/ML IJ SOLN: 1.0000 mg | Freq: Once | INTRAMUSCULAR | Status: DC

## 2012-02-07 MED ORDER — CEFTRIAXONE SODIUM 250 MG IJ SOLR
250.0000 mg | Freq: Once | INTRAMUSCULAR | Status: DC
  Filled 2012-02-07: qty 250

## 2012-02-07 MED ORDER — SULFAMETHOXAZOLE-TRIMETHOPRIM 800-160 MG PO TABS: 1.0000 | ORAL_TABLET | Freq: Two times a day (BID) | ORAL | Status: AC

## 2012-02-07 MED ORDER — KETOROLAC TROMETHAMINE 30 MG/ML IJ SOLN
30.0000 mg | Freq: Once | INTRAMUSCULAR | Status: AC
  Administered 2012-02-07: 30 mg via INTRAVENOUS
  Filled 2012-02-07: qty 1

## 2012-02-07 MED ORDER — SODIUM CHLORIDE 0.9 % IV SOLN
INTRAVENOUS | Status: DC
  Administered 2012-02-07: 23:00:00 via INTRAVENOUS

## 2012-02-07 MED ORDER — ONDANSETRON HCL 8 MG PO TABS: 8.0000 mg | ORAL_TABLET | Freq: Three times a day (TID) | ORAL | Status: AC | PRN

## 2012-02-07 MED ORDER — ONDANSETRON HCL 4 MG/2ML IJ SOLN
4.0000 mg | Freq: Once | INTRAMUSCULAR | Status: AC
  Administered 2012-02-07: 4 mg via INTRAVENOUS
  Filled 2012-02-07: qty 2

## 2012-02-07 MED ORDER — AZITHROMYCIN 250 MG PO TABS
1000.0000 mg | ORAL_TABLET | Freq: Once | ORAL | Status: AC
  Administered 2012-02-08: 1000 mg via ORAL
  Filled 2012-02-07: qty 4

## 2012-02-07 MED ORDER — IBUPROFEN 800 MG PO TABS: 800.0000 mg | ORAL_TABLET | Freq: Three times a day (TID) | ORAL | Status: AC | PRN

## 2012-02-07 MED ORDER — SODIUM CHLORIDE 0.9 % IV BOLUS (SEPSIS)
1000.0000 mL | Freq: Once | INTRAVENOUS | Status: AC
  Administered 2012-02-07: 1000 mL via INTRAVENOUS

## 2012-02-07 NOTE — ED Notes (Signed)
Pt with abd pain and cramping.  Feels bloated in abd.

## 2012-02-07 NOTE — ED Provider Notes (Signed)
History  This chart was scribed for Felisa Bonier, MD by Bennett Scrape. This patient was seen in room [facility]01/[facility]01 and the patient's care was started at 10:19PM.   CSN: [id number]  Arrival date & time 02/07/12  2108   First MD Initiated Contact with Patient 02/07/12 2156      Chief Complaint  Patient presents with  . Abdominal Pain  . Nausea  . Emesis    The history is provided by the patient. No language interpreter was used.    April Boyer is a 31 y.o. female who presents to the Emergency Department complaining of 3 weeks of gradual onset, gradually worsening, constant lower abdominal pain with associated nausea and non-bloody emesis. She reports that the nausea and emesis worse at night.  She has not tried any at home medications to improve symptoms. She denies having any prior episodes of similar symptoms. She has not been to see her PCP or a specialist for the symptoms.  She states that her LNMP was 12/12/11. She denies the possibility of being pregnant by stating that she had tubal ligation surgery.  She also c/o urinary frequency but states that she attributes this symptoms to being on lasix. She denies dysuria, diarrhea, fever, cough and vaginal discharge as associated symptoms. She has a h/o chronic back pain. She is a current everyday smoker but denies alcohol use.   Past Medical History  Diagnosis Date  . Acid reflux   . Chronic back pain     Past Surgical History  Procedure Date  . Foot surgery   . Tubal ligation     No family history on file.  History  Substance Use Topics  . Smoking status: Current Everyday Smoker -- 1.0 packs/day  . Smokeless tobacco: Not on file  . Alcohol Use: No    Review of Systems  Constitutional: Negative for fever and chills.  HENT: Negative for congestion, sore throat and neck pain.   Eyes: Negative for pain.  Respiratory: Negative for cough and shortness of breath.   Cardiovascular: Negative for chest pain.    Gastrointestinal: Positive for nausea, vomiting and abdominal pain. Negative for diarrhea.  Genitourinary: Positive for frequency. Negative for dysuria, urgency, hematuria and vaginal discharge.  Musculoskeletal: Negative for back pain.  Skin: Negative for rash.  Neurological: Negative for seizures and headaches.  Psychiatric/Behavioral: Negative for confusion.    Allergies  Review of patient's allergies indicates no known allergies.  Home Medications  No current outpatient prescriptions on file.  Triage Vitals: BP 125/74  Pulse 94  Temp(Src) 97.9 F (36.6 C) (Oral)  Resp 20  Ht 5\' 7"  (1.702 m)  Wt 218 lb (98.884 kg)  BMI 34.14 kg/m2  SpO2 100%    Physical Exam  Nursing note and vitals reviewed. Constitutional: She is oriented to person, place, and time. She appears well-developed and well-nourished.  HENT:  Head: Normocephalic and atraumatic.       Mucous membranes are dry, normal oropharynx  Eyes: Conjunctivae and EOM are normal.  Neck: Normal range of motion. Neck supple.  Cardiovascular: Normal rate, regular rhythm and normal heart sounds.  Exam reveals no gallop and no friction rub.   No murmur heard. Pulmonary/Chest: Effort normal and breath sounds normal. No respiratory distress. She has no wheezes. She has no rales.       Lungs are clear to ausculation, no rhonchi noted  Abdominal: Soft. Bowel sounds are normal. She exhibits no mass. There is tenderness (Mild tenderness at lower abdomen  bilaterally). There is no rebound and no guarding.  Neurological: She is alert and oriented to person, place, and time.  Skin: Skin is warm and dry.  Psychiatric: She has a normal mood and affect. Her behavior is normal.    ED Course  Procedures (including critical care time)  DIAGNOSTIC STUDIES: Oxygen Saturation is 100% on room air, normal by my interpretation.    COORDINATION OF CARE: 10:22PM-Discussed urinalysis and medications with pt and back agreed to plan.   Labs  Reviewed - No data to display No results found.   No diagnosis found.    MDM  Urinary tract infection, pregnancy, hepatitis, cholelithiasis/cholecystitis, pancreatitis all entertained amongst other potential diagnoses.  The patient does not have cva tenderness to suggest pyelonephritis.  She denies vaginal discharge.  She is well appearing and in no apparent distress.      I personally performed the services described in this documentation, which was scribed in my presence. The recorded information has been reviewed and considered.    Felisa Bonier, MD 02/07/12 [phone]

## 2012-02-07 NOTE — ED Notes (Signed)
Pt ambulated to restroom at this time no assistance needed!Marland Kitchen April Boyer

## 2012-02-07 NOTE — Discharge Instructions (Signed)
Your symptoms appear to be due to urinary tract infection (abdominal pain and nausea/vomiting).  Take the prescribed antibiotics until finished to treat urinary tract infection, and use the prescribed medication Zofran as directed and as needed for nausea.  You were found to have trichomonas in your urine.  This is a sexually transmitted bacteria.  You were given a single dose antibiotic treatment in the ED to treat this.  Because exposure to one sexually transmitted bacteria often places you at risk for infection with other sexually transmitted bacteria (gonorrhea and chlamydia) you were given antibiotic treatment against these as well.  You should refrain from sexual intercourse with anyone for the next 72 hours while the antibiotics work.  Furthermore, you should not engage in sexual intercourse with any of your previous sexual partners until they have been tested and treated for sexually transmitted infections, as they may be infected and can pass this infection back to you.  Urinary Tract Infection Infections of the urinary tract can start in several places. A bladder infection (cystitis), a kidney infection (pyelonephritis), and a prostate infection (prostatitis) are different types of urinary tract infections (UTIs). They usually get better if treated with medicines (antibiotics) that kill germs. Take all the medicine until it is gone. You or your child may feel better in a few days, but TAKE ALL MEDICINE or the infection may not respond and may become more difficult to treat. HOME CARE INSTRUCTIONS   Drink enough water and fluids to keep the urine clear or pale yellow. Cranberry juice is especially recommended, in addition to large amounts of water.   Avoid caffeine, tea, and carbonated beverages. They tend to irritate the bladder.   Alcohol may irritate the prostate.   Only take over-the-counter or prescription medicines for pain, discomfort, or fever as directed by your caregiver.  To  prevent further infections:  Empty the bladder often. Avoid holding urine for long periods of time.   After a bowel movement, women should cleanse from front to back. Use each tissue only once.   Empty the bladder before and after sexual intercourse.  FINDING OUT THE RESULTS OF YOUR TEST Not all test results are available during your visit. If your or your child's test results are not back during the visit, make an appointment with your caregiver to find out the results. Do not assume everything is normal if you have not heard from your caregiver or the medical facility. It is important for you to follow up on all test results. SEEK MEDICAL CARE IF:   There is back pain.   Your baby is older than 3 months with a rectal temperature of 100.5 F (38.1 C) or higher for more than 1 day.   Your or your child's problems (symptoms) are no better in 3 days. Return sooner if you or your child is getting worse.  SEEK IMMEDIATE MEDICAL CARE IF:   There is severe back pain or lower abdominal pain.   You or your child develops chills.   You have a fever.   Your baby is older than 3 months with a rectal temperature of 102 F (38.9 C) or higher.   Your baby is 28 months old or younger with a rectal temperature of 100.4 F (38 C) or higher.   There is nausea or vomiting.   There is continued burning or discomfort with urination.  MAKE SURE YOU:   Understand these instructions.   Will watch your condition.   Will get help right  away if you are not doing well or get worse.  Document Released: 08/03/2005 Document Revised: 10/13/2011 Document Reviewed: 03/08/2007 [facility] Patient Information 2012 [place], [place].Trichomoniasis Trichomoniasis is an infection, caused by the Trichomonas organism, that affects both women and men. In women, the outer female genitalia and the vagina are affected. In men, the penis is mainly affected, but the prostate and other reproductive organs can also be involved.  Trichomoniasis is a sexually transmitted disease (STD) and is most often passed to another person through sexual contact. The majority of people who get trichomoniasis do so from a sexual encounter and are also at risk for other STDs. CAUSES   Sexual intercourse with an infected partner.   It can be present in swimming pools or hot tubs.  SYMPTOMS   Abnormal gray-green frothy vaginal discharge in women.   Vaginal itching and irritation in women.   Itching and irritation of the area outside the vagina in women.   Penile discharge with or without pain in males.   Inflammation of the urethra (urethritis), causing painful urination.   Bleeding after sexual intercourse.  RELATED COMPLICATIONS  Pelvic inflammatory disease.   Infection of the uterus (endometritis).   Infertility.   Tubal (ectopic) pregnancy.   It can be associated with other STDs, including gonorrhea and chlamydia, hepatitis B, and HIV.  COMPLICATIONS DURING PREGNANCY  Early (premature) delivery.   Premature rupture of the membranes (PROM).   Low birth weight.  DIAGNOSIS   Visualization of Trichomonas under the microscope from the vagina discharge.   Ph of the vagina greater than 4.5, tested with a test tape.   Trich Rapid Test.   Culture of the organism, but this is not usually needed.   It may be found on a Pap test.   Having a "strawberry cervix,"which means the cervix looks very red like a strawberry.  TREATMENT   You may be given medication to fight the infection. Inform your caregiver if you could be or are pregnant. Some medications used to treat the infection should not be taken during pregnancy.   Over-the-counter medications or creams to decrease itching or irritation may be recommended.   Your sexual partner will need to be treated if infected.  HOME CARE INSTRUCTIONS   Take all medication prescribed by your caregiver.   Take over-the-counter medication for itching or irritation as  directed by your caregiver.   Do not have sexual intercourse while you have the infection.   Do not douche or wear tampons.   Discuss your infection with your partner, as your partner may have acquired the infection from you. Or, your partner may have been the person who transmitted the infection to you.   Have your sex partner examined and treated if necessary.   Practice safe, informed, and protected sex.   See your caregiver for other STD testing.  SEEK MEDICAL CARE IF:   You still have symptoms after you finish the medication.   You have an oral temperature above 102 F (38.9 C).   You develop belly (abdominal) pain.   You have pain when you urinate.   You have bleeding after sexual intercourse.   You develop a rash.   The medication makes you sick or makes you throw up (vomit).  Document Released: 04/19/2001 Document Revised: 10/13/2011 Document Reviewed: 05/15/2009 [facility] Patient Information 2012 [place], [place].Trichomoniasis Trichomoniasis is an infection, caused by the Trichomonas organism, that affects both women and men. In women, the outer female genitalia and the vagina are  affected. In men, the penis is mainly affected, but the prostate and other reproductive organs can also be involved. Trichomoniasis is a sexually transmitted disease (STD) and is most often passed to another person through sexual contact. The majority of people who get trichomoniasis do so from a sexual encounter and are also at risk for other STDs. CAUSES   Sexual intercourse with an infected partner.   It can be present in swimming pools or hot tubs.  SYMPTOMS   Abnormal gray-green frothy vaginal discharge in women.   Vaginal itching and irritation in women.   Itching and irritation of the area outside the vagina in women.   Penile discharge with or without pain in males.   Inflammation of the urethra (urethritis), causing painful urination.   Bleeding after sexual intercourse.    RELATED COMPLICATIONS  Pelvic inflammatory disease.   Infection of the uterus (endometritis).   Infertility.   Tubal (ectopic) pregnancy.   It can be associated with other STDs, including gonorrhea and chlamydia, hepatitis B, and HIV.  COMPLICATIONS DURING PREGNANCY  Early (premature) delivery.   Premature rupture of the membranes (PROM).   Low birth weight.  DIAGNOSIS   Visualization of Trichomonas under the microscope from the vagina discharge.   Ph of the vagina greater than 4.5, tested with a test tape.   Trich Rapid Test.   Culture of the organism, but this is not usually needed.   It may be found on a Pap test.   Having a "strawberry cervix,"which means the cervix looks very red like a strawberry.  TREATMENT   You may be given medication to fight the infection. Inform your caregiver if you could be or are pregnant. Some medications used to treat the infection should not be taken during pregnancy.   Over-the-counter medications or creams to decrease itching or irritation may be recommended.   Your sexual partner will need to be treated if infected.  HOME CARE INSTRUCTIONS   Take all medication prescribed by your caregiver.   Take over-the-counter medication for itching or irritation as directed by your caregiver.   Do not have sexual intercourse while you have the infection.   Do not douche or wear tampons.   Discuss your infection with your partner, as your partner may have acquired the infection from you. Or, your partner may have been the person who transmitted the infection to you.   Have your sex partner examined and treated if necessary.   Practice safe, informed, and protected sex.   See your caregiver for other STD testing.  SEEK MEDICAL CARE IF:   You still have symptoms after you finish the medication.   You have an oral temperature above 102 F (38.9 C).   You develop belly (abdominal) pain.   You have pain when you urinate.   You  have bleeding after sexual intercourse.   You develop a rash.   The medication makes you sick or makes you throw up (vomit).  Document Released: 04/19/2001 Document Revised: 10/13/2011 Document Reviewed: 05/15/2009 [facility] Patient Information 2012 [place], [place].

## 2012-02-08 LAB — URINE CULTURE

## 2012-02-08 NOTE — ED Notes (Signed)
Notified dr. Fredricka Bonine that patient refused rocephin im injection. Dr. Fredricka Bonine stated to ask patient if she would take rocephin iv, patient's iv was still in place, patient refused medication iv as well and asked me to take iv out.

## 2012-03-28 ENCOUNTER — Emergency Department (HOSPITAL_COMMUNITY)
Admission: EM | Admit: 2012-03-28 | Discharge: 2012-03-29 | Disposition: A | Discharge status: Home / Self Care | Payer: BC Managed Care – PPO | Attending: Emergency Medicine | Admitting: Emergency Medicine

## 2012-03-28 ENCOUNTER — Emergency Department (HOSPITAL_COMMUNITY): Payer: BC Managed Care – PPO

## 2012-03-28 ENCOUNTER — Encounter (HOSPITAL_COMMUNITY): Payer: Self-pay | Admitting: *Deleted

## 2012-03-28 DIAGNOSIS — K219 Gastro-esophageal reflux disease without esophagitis: Secondary | ICD-10-CM | POA: Insufficient documentation

## 2012-03-28 DIAGNOSIS — R300 Dysuria: Secondary | ICD-10-CM | POA: Insufficient documentation

## 2012-03-28 DIAGNOSIS — R112 Nausea with vomiting, unspecified: Secondary | ICD-10-CM | POA: Insufficient documentation

## 2012-03-28 DIAGNOSIS — R319 Hematuria, unspecified: Secondary | ICD-10-CM | POA: Insufficient documentation

## 2012-03-28 DIAGNOSIS — M549 Dorsalgia, unspecified: Secondary | ICD-10-CM | POA: Insufficient documentation

## 2012-03-28 DIAGNOSIS — N760 Acute vaginitis: Secondary | ICD-10-CM | POA: Insufficient documentation

## 2012-03-28 DIAGNOSIS — Z79899 Other long term (current) drug therapy: Secondary | ICD-10-CM | POA: Insufficient documentation

## 2012-03-28 DIAGNOSIS — B9689 Other specified bacterial agents as the cause of diseases classified elsewhere: Secondary | ICD-10-CM | POA: Insufficient documentation

## 2012-03-28 DIAGNOSIS — A499 Bacterial infection, unspecified: Secondary | ICD-10-CM | POA: Insufficient documentation

## 2012-03-28 DIAGNOSIS — R109 Unspecified abdominal pain: Secondary | ICD-10-CM

## 2012-03-28 DIAGNOSIS — G8929 Other chronic pain: Secondary | ICD-10-CM | POA: Insufficient documentation

## 2012-03-28 HISTORY — DX: Insomnia, unspecified: G47.00

## 2012-03-28 HISTORY — DX: Disorder of kidney and ureter, unspecified: N28.9

## 2012-03-28 LAB — WET PREP, GENITAL
Trich, Wet Prep: NONE SEEN
Yeast Wet Prep HPF POC: NONE SEEN

## 2012-03-28 LAB — DIFFERENTIAL
Basophils Relative: 0 % (ref 0–1)
Eosinophils Absolute: 0.2 10*3/uL (ref 0.0–0.7)
Eosinophils Relative: 3 % (ref 0–5)
Lymphs Abs: 3 10*3/uL (ref 0.7–4.0)

## 2012-03-28 LAB — BASIC METABOLIC PANEL
Calcium: 9.6 mg/dL (ref 8.4–10.5)
GFR calc Af Amer: >90 mL/min (ref >90)
GFR calc non Af Amer: >90 mL/min (ref >90)
Glucose, Bld: 106 mg/dL — ABNORMAL HIGH (ref 70–99)
Sodium: 135 mEq/L (ref 135–145)

## 2012-03-28 LAB — URINALYSIS, ROUTINE W REFLEX MICROSCOPIC
Bilirubin Urine: NEGATIVE
Glucose, UA: NEGATIVE mg/dL
Hgb urine dipstick: NEGATIVE
Specific Gravity, Urine: 1.025 (ref 1.005–1.030)
pH: 6 (ref 5.0–8.0)

## 2012-03-28 LAB — CBC
MCH: 22 pg — ABNORMAL LOW (ref 26.0–34.0)
MCHC: 31.1 g/dL (ref 30.0–36.0)
MCV: 70.8 fL — ABNORMAL LOW (ref 78.0–100.0)
Platelets: 375 10*3/uL (ref 150–400)
RBC: 4.59 MIL/uL (ref 3.87–5.11)
RDW: 17 % — ABNORMAL HIGH (ref 11.5–15.5)

## 2012-03-28 LAB — PREGNANCY, URINE: Preg Test, Ur: NEGATIVE

## 2012-03-28 MED ORDER — HYDROMORPHONE HCL PF 1 MG/ML IJ SOLN
1.0000 mg | Freq: Once | INTRAMUSCULAR | Status: AC
  Administered 2012-03-28: 1 mg via INTRAVENOUS
  Filled 2012-03-28: qty 1

## 2012-03-28 MED ORDER — METRONIDAZOLE 500 MG PO TABS
2000.0000 mg | ORAL_TABLET | Freq: Once | ORAL | Status: AC
  Administered 2012-03-28: 2000 mg via ORAL
  Filled 2012-03-28: qty 4

## 2012-03-28 MED ORDER — SODIUM CHLORIDE 0.9 % IV BOLUS (SEPSIS)
1000.0000 mL | Freq: Once | INTRAVENOUS | Status: AC
  Administered 2012-03-28: 1000 mL via INTRAVENOUS

## 2012-03-28 MED ORDER — ONDANSETRON HCL 4 MG/2ML IJ SOLN
4.0000 mg | Freq: Once | INTRAMUSCULAR | Status: AC
  Administered 2012-03-28: 4 mg via INTRAVENOUS
  Filled 2012-03-28: qty 2

## 2012-03-28 MED ORDER — KETOROLAC TROMETHAMINE 30 MG/ML IJ SOLN
30.0000 mg | Freq: Once | INTRAMUSCULAR | Status: AC
  Administered 2012-03-28: 30 mg via INTRAVENOUS
  Filled 2012-03-28: qty 1

## 2012-03-28 NOTE — ED Notes (Signed)
C/o right flank pain radiating to RLQ, hematuria x 1 week. C/o nausea; denies vomiting, denies diarrhea; hx of kidney stones.

## 2012-03-28 NOTE — ED Provider Notes (Signed)
History  This chart was scribed for Glynn Octave, MD by Bennett Scrape. This patient was seen in room APA09/APA09 and the patient's care was started at 9:18PM.  CSN: [id number]  Arrival date & time 03/28/12  2107   First MD Initiated Contact with Patient 03/28/12 2118      Chief Complaint  Patient presents with  . Abdominal Pain  . Hematuria   The history is provided by the patient. No language interpreter was used.    April Boyer is a 31 y.o. female who presents to the Emergency Department complaining of one week of gradual onset, gradually worsening, intermittent right flank pain that radiates to the RLQ with associated hematuria described as passing blood clots, dysuria nausea, and emesis. She reports 2 episodes of non-bloody emesis today. She states that the symptoms are felt intermittently but that the pain is worse today. She reports that she has been taking tylenol with mild improvement in pain. She has a h/o kidney stones and states that this pain is similar to prior episodes of kidney stones. She denies needing to have surgery for prior kidney stones. She denies diarrhea, vaginal bleeding, vaginal discharge and back pain as associated symptoms. She has a h/o GERD and chronic back pain. She is a current everyday smoker but denies alcohol use.  PCP is PA O'Brien for Dr. Laurine Blazer in [place].     Past Medical History  Diagnosis Date  . Acid reflux   . Chronic back pain   . Renal disorder     kidney stones  . Insomnia     Past Surgical History  Procedure Date  . Foot surgery   . Tubal ligation     No family history on file.  History  Substance Use Topics  . Smoking status: Current Everyday Smoker -- 1.0 packs/day  . Smokeless tobacco: Not on file  . Alcohol Use: No     Review of Systems  A complete 10 system review of systems was obtained and all systems are negative except as noted in the HPI and PMH.   Allergies  Review of patient's allergies  indicates no known allergies.  Home Medications   Current Outpatient Rx  Name Route Sig Dispense Refill  . ACETAMINOPHEN 325 MG PO TABS Oral Take 650 mg by mouth every 6 (six) hours as needed. For pain    . AMITRIPTYLINE HCL 100 MG PO TABS Oral Take 100 mg by mouth at bedtime.    . OMEPRAZOLE 20 MG PO CPDR Oral Take 20 mg by mouth 2 (two) times daily.    Marland Kitchen BUPRENORPHINE HCL-NALOXONE HCL 8-2 MG SL FILM Sublingual Place 1 Film under the tongue 2 (two) times daily.    Marland Kitchen DOCUSATE SODIUM 100 MG PO CAPS Oral Take 300 mg by mouth daily.    . FUROSEMIDE 20 MG PO TABS Oral Take 20 mg by mouth 2 (two) times daily.    Marland Kitchen KETOROLAC TROMETHAMINE 10 MG PO TABS Oral Take 10 mg by mouth daily as needed. For pain      Triage Vitals: BP 118/74  Pulse 96  Temp(Src) 97.8 F (36.6 C) (Oral)  Resp 20  Ht 5\' 7"  (1.702 m)  Wt 218 lb (98.884 kg)  BMI 34.14 kg/m2  SpO2 100%  LMP 03/13/2012  Physical Exam  Nursing note and vitals reviewed. Constitutional: She is oriented to person, place, and time. She appears well-developed and well-nourished. No distress.  HENT:  Head: Normocephalic and atraumatic.  Eyes: Conjunctivae and EOM  are normal.  Neck: Normal range of motion. Neck supple. No tracheal deviation present.  Cardiovascular: Normal rate, regular rhythm and normal heart sounds.   Pulmonary/Chest: Effort normal and breath sounds normal. No respiratory distress.  Abdominal: Soft. There is tenderness (mild RLQ tenderness). There is no rebound and no guarding.       Right CVA tenderness   Genitourinary: There is no tenderness on the right labia. Cervix exhibits no motion tenderness and no discharge. Right adnexum displays tenderness. Left adnexum displays no mass and no tenderness. No vaginal discharge found.  Musculoskeletal: Normal range of motion. She exhibits no edema.  Neurological: She is alert and oriented to person, place, and time.  Skin: Skin is warm and dry.  Psychiatric: She has a normal  mood and affect. Her behavior is normal.    ED Course  Procedures (including critical care time)  DIAGNOSTIC STUDIES: Oxygen Saturation is 100% on room air, normal by my interpretation.    COORDINATION OF CARE: 9:25PM-Discussed treatment plan with pt and pt agreed to plan.   Labs Reviewed  URINALYSIS, ROUTINE W REFLEX MICROSCOPIC  PREGNANCY, URINE  WET PREP, GENITAL  GC/CHLAMYDIA PROBE AMP, GENITAL  CBC  DIFFERENTIAL  BASIC METABOLIC PANEL    Lab Results Entered Manually 03/28/12:  Negative Overall Test   Result WBC    7.02 (10^3/uL) RBC   4.29 (10^6/uL) HBG   10.1 (g/dL) HTC   09.8 (%) MCV   11.9 (fL)   MCH   22.0 (pg)   MCHC   31.1 (g/dL) PLT   147 (82^9/FA) RDW-SD  43.6 (fL) RDW-CV  17.0 (%) MPV   9.2 (fL) NEUT   3.46 (10^3/uL)  49.3 (%) LYMPH  2.95 (10^3/uL)  42.0 (%) MONO   0.35 (10^3/uL)  5.0 (%) EO   0.24 (10^3/uL)  3.4 (%) BASO   0.02 (10^3/uL)  0.3 (%) IG   0.01 (10^3/uL)  0.1 (%) NRBC   0.00 (10^3/uL)  0.0 (/100WBC) RET   1.83 (%)  0.084 (10^6/uL) IRF   14.0 (%)  Test   Result   Unit Sodium  135  mmol/L Potassium  3.6  mmol/L Chloride  102  mmol/L CO2   24  mmol/L Glucose  106  mg/dL BUN    8  mg/dL Creatinie  2.13  mg/dL Anion Gap  9 L   25 H   1 I   0 Calcium  9.6  mg/dL  No results found.   No diagnosis found.    MDM  One week of intermittent right flank pain radiating to right lower quadrant associated with hematuria and nausea. History of kidney stones have not required intervention. Patient does still have an appendix but has had a partial hysterectomy.  Urinalysis negative for infection or hematuria. Persistent right lower quadrant pain. Right adnexal tenderness on exam without significant vaginal discharge or cervical motion tenderness.  We'll perform CT imaging to evaluate for appendicitis versus other pathology. Case signed out to Dr. Colon Branch at change of shift.  I personally performed the services described in this  documentation, which was scribed in my presence.  The recorded information has been reviewed and considered.     Glynn Octave, MD 03/28/12 [phone]

## 2012-03-29 MED ORDER — HYDROMORPHONE HCL PF 1 MG/ML IJ SOLN
1.0000 mg | Freq: Once | INTRAMUSCULAR | Status: AC
  Administered 2012-03-29: 1 mg via INTRAVENOUS
  Filled 2012-03-29: qty 1

## 2012-03-29 MED ORDER — IOHEXOL 300 MG/ML  SOLN
100.0000 mL | Freq: Once | INTRAMUSCULAR | Status: AC | PRN
  Administered 2012-03-29: 100 mL via INTRAVENOUS

## 2012-03-29 NOTE — ED Provider Notes (Signed)
2330 Assumed care/disposition of patient. Patient who presented with gradual worsening of right flank pain with radiation tot he RLQ similar to previous kidney stones. UA negatvie. Pelvic exam with mild RLQ discomfort. CT pending.  0220 CT negative for appendicitis, adnexal abnormalities, kidney stones.Dx testing d/w pt and husband.  Questions answered.  Verb understanding, agreeable to d/c home with outpt f/u.Pt stable in ED with no significant deterioration in condition.The patient appears reasonably screened and/or stabilized for discharge and I doubt any other medical condition or other [facility] requiring further screening, evaluation, or treatment in the ED at this time prior to discharge.   Ct Abdomen Pelvis W Contrast  03/29/2012  *RADIOLOGY REPORT*  Clinical Data: Right flank pain  CT ABDOMEN AND PELVIS WITH CONTRAST  Technique:  Multidetector CT imaging of the abdomen and pelvis was performed following the standard protocol during bolus administration of intravenous contrast.  Contrast: OMNIPAQUE IOHEXOL 300 MG/ML  SOLN  Comparison: 09/10/2011  Findings: Mild ground-glass opacity within the left lower lobe. Otherwise lung bases are clear.  Heart size within normal limits. No pleural or pericardial effusion.  Diffuse low attenuation of the liver without focal abnormality. Unremarkable biliary system, spleen, pancreas, adrenal glands.  Symmetric renal enhancement.  No hydronephrosis or hydroureter.  No urinary tract calculi identified.  No bowel obstruction.  No CT evidence for colitis.  Normal appendix.  No free intraperitoneal air or fluid.  No lymphadenopathy.  There is mild atherosclerotic plaque of the infrarenal aorta.  Nonspecific appearance to the uterus. No adnexal mass.  Thin-walled bladder.  No acute osseous finding.  IMPRESSION: No acute CT abnormality identified. No hydronephrosis.  Normal appendix.  Hepatic steatosis.  Mild atherosclerotic disease of the aorta.  Original Report  Authenticated By: Waneta Martins, M.D.     Nicoletta Dress. Colon Branch, MD 03/29/12 Earle Gell

## 2012-03-29 NOTE — Discharge Instructions (Signed)
Your CT scan did not show appendicitis, any infection in the ovary, or kidney stones. You may have passed a stone that caused your original pain but that stone is no longer there. You have a baginal infection called bacterial vaginosis which has been treated while you were here with antibiotics. Drink lots of fluids. Continue to use your home medicines.Follow up with your doctor.   Abdominal Pain (Nonspecific) Your exam might not show the exact reason you have abdominal pain. Since there are many different causes of abdominal pain, another checkup and more tests may be needed. It is very important to follow up for lasting (persistent) or worsening symptoms. A possible cause of abdominal pain in any person who still has his or her appendix is acute appendicitis. Appendicitis is often hard to diagnose. Normal blood tests, urine tests, ultrasound, and CT scans do not completely rule out early appendicitis or other causes of abdominal pain. Sometimes, only the changes that happen over time will allow appendicitis and other causes of abdominal pain to be determined. Other potential problems that may require surgery may also take time to become more apparent. Because of this, it is important that you follow all of the instructions below. HOME CARE INSTRUCTIONS   Rest as much as possible.   Do not eat solid food until your pain is gone.   While adults or children have pain: A diet of water, weak decaffeinated tea, broth or bouillon, gelatin, oral rehydration solutions (ORS), frozen ice pops, or ice chips may be helpful.   When pain is gone in adults or children: Start a light diet (dry toast, crackers, applesauce, or white rice). Increase the diet slowly as long as it does not bother you. Eat no dairy products (including cheese and eggs) and no spicy, fatty, fried, or high-fiber foods.   Use no alcohol, caffeine, or cigarettes.   Take your regular medicines unless your caregiver told you not to.   Take  any prescribed medicine as directed.   Only take over-the-counter or prescription medicines for pain, discomfort, or fever as directed by your caregiver. Do not give aspirin to children.  If your caregiver has given you a follow-up appointment, it is very important to keep that appointment. Not keeping the appointment could result in a permanent injury and/or lasting (chronic) pain and/or disability. If there is any problem keeping the appointment, you must call to reschedule.  SEEK IMMEDIATE MEDICAL CARE IF:   Your pain is not gone in 24 hours.   Your pain becomes worse, changes location, or feels different.   You or your child has an oral temperature above 102 F (38.9 C), not controlled by medicine.   Your baby is older than 3 months with a rectal temperature of 102 F (38.9 C) or higher.   Your baby is 90 months old or younger with a rectal temperature of 100.4 F (38 C) or higher.   You have shaking chills.   You keep throwing up (vomiting) or cannot drink liquids.   There is blood in your vomit or you see blood in your bowel movements.   Your bowel movements become dark or black.   You have frequent bowel movements.   Your bowel movements stop (become blocked) or you cannot pass gas.   You have bloody, frequent, or painful urination.   You have yellow discoloration in the skin or whites of the eyes.   Your stomach becomes bloated or bigger.   You have dizziness or fainting.  You have chest or back pain.  MAKE SURE YOU:   Understand these instructions.   Will watch your condition.   Will get help right away if you are not doing well or get worse.  Document Released: 10/24/2005 Document Revised: 10/13/2011 Document Reviewed: 09/21/2009 [facility] Patient Information 2012 [place], [place].Abdominal Pain (Nonspecific) Your exam might not show the exact reason you have abdominal pain. Since there are many different causes of abdominal pain, another checkup and more  tests may be needed. It is very important to follow up for lasting (persistent) or worsening symptoms. A possible cause of abdominal pain in any person who still has his or her appendix is acute appendicitis. Appendicitis is often hard to diagnose. Normal blood tests, urine tests, ultrasound, and CT scans do not completely rule out early appendicitis or other causes of abdominal pain. Sometimes, only the changes that happen over time will allow appendicitis and other causes of abdominal pain to be determined. Other potential problems that may require surgery may also take time to become more apparent. Because of this, it is important that you follow all of the instructions below. HOME CARE INSTRUCTIONS   Rest as much as possible.   Do not eat solid food until your pain is gone.   While adults or children have pain: A diet of water, weak decaffeinated tea, broth or bouillon, gelatin, oral rehydration solutions (ORS), frozen ice pops, or ice chips may be helpful.   When pain is gone in adults or children: Start a light diet (dry toast, crackers, applesauce, or white rice). Increase the diet slowly as long as it does not bother you. Eat no dairy products (including cheese and eggs) and no spicy, fatty, fried, or high-fiber foods.   Use no alcohol, caffeine, or cigarettes.   Take your regular medicines unless your caregiver told you not to.   Take any prescribed medicine as directed.   Only take over-the-counter or prescription medicines for pain, discomfort, or fever as directed by your caregiver. Do not give aspirin to children.  If your caregiver has given you a follow-up appointment, it is very important to keep that appointment. Not keeping the appointment could result in a permanent injury and/or lasting (chronic) pain and/or disability. If there is any problem keeping the appointment, you must call to reschedule.  SEEK IMMEDIATE MEDICAL CARE IF:   Your pain is not gone in 24 hours.   Your  pain becomes worse, changes location, or feels different.   You or your child has an oral temperature above 102 F (38.9 C), not controlled by medicine.   Your baby is older than 3 months with a rectal temperature of 102 F (38.9 C) or higher.   Your baby is 11 months old or younger with a rectal temperature of 100.4 F (38 C) or higher.   You have shaking chills.   You keep throwing up (vomiting) or cannot drink liquids.   There is blood in your vomit or you see blood in your bowel movements.   Your bowel movements become dark or black.   You have frequent bowel movements.   Your bowel movements stop (become blocked) or you cannot pass gas.   You have bloody, frequent, or painful urination.   You have yellow discoloration in the skin or whites of the eyes.   Your stomach becomes bloated or bigger.   You have dizziness or fainting.   You have chest or back pain.  MAKE SURE YOU:   Understand  these instructions.   Will watch your condition.   Will get help right away if you are not doing well or get worse.  Document Released: 10/24/2005 Document Revised: 10/13/2011 Document Reviewed: 09/21/2009 [facility] Patient Information 2012 [place], [place].

## 2012-04-11 IMAGING — CR DG HIP COMPLETE 2+V*R*
3 series · 3 of 3 positions shown · non-contrast
Comparison: None

CLINICAL DATA: Right hip pain secondary to a fall today peri

RIGHT HIP - COMPLETE 2+ VIEW

[view not recorded (1 of 3)]
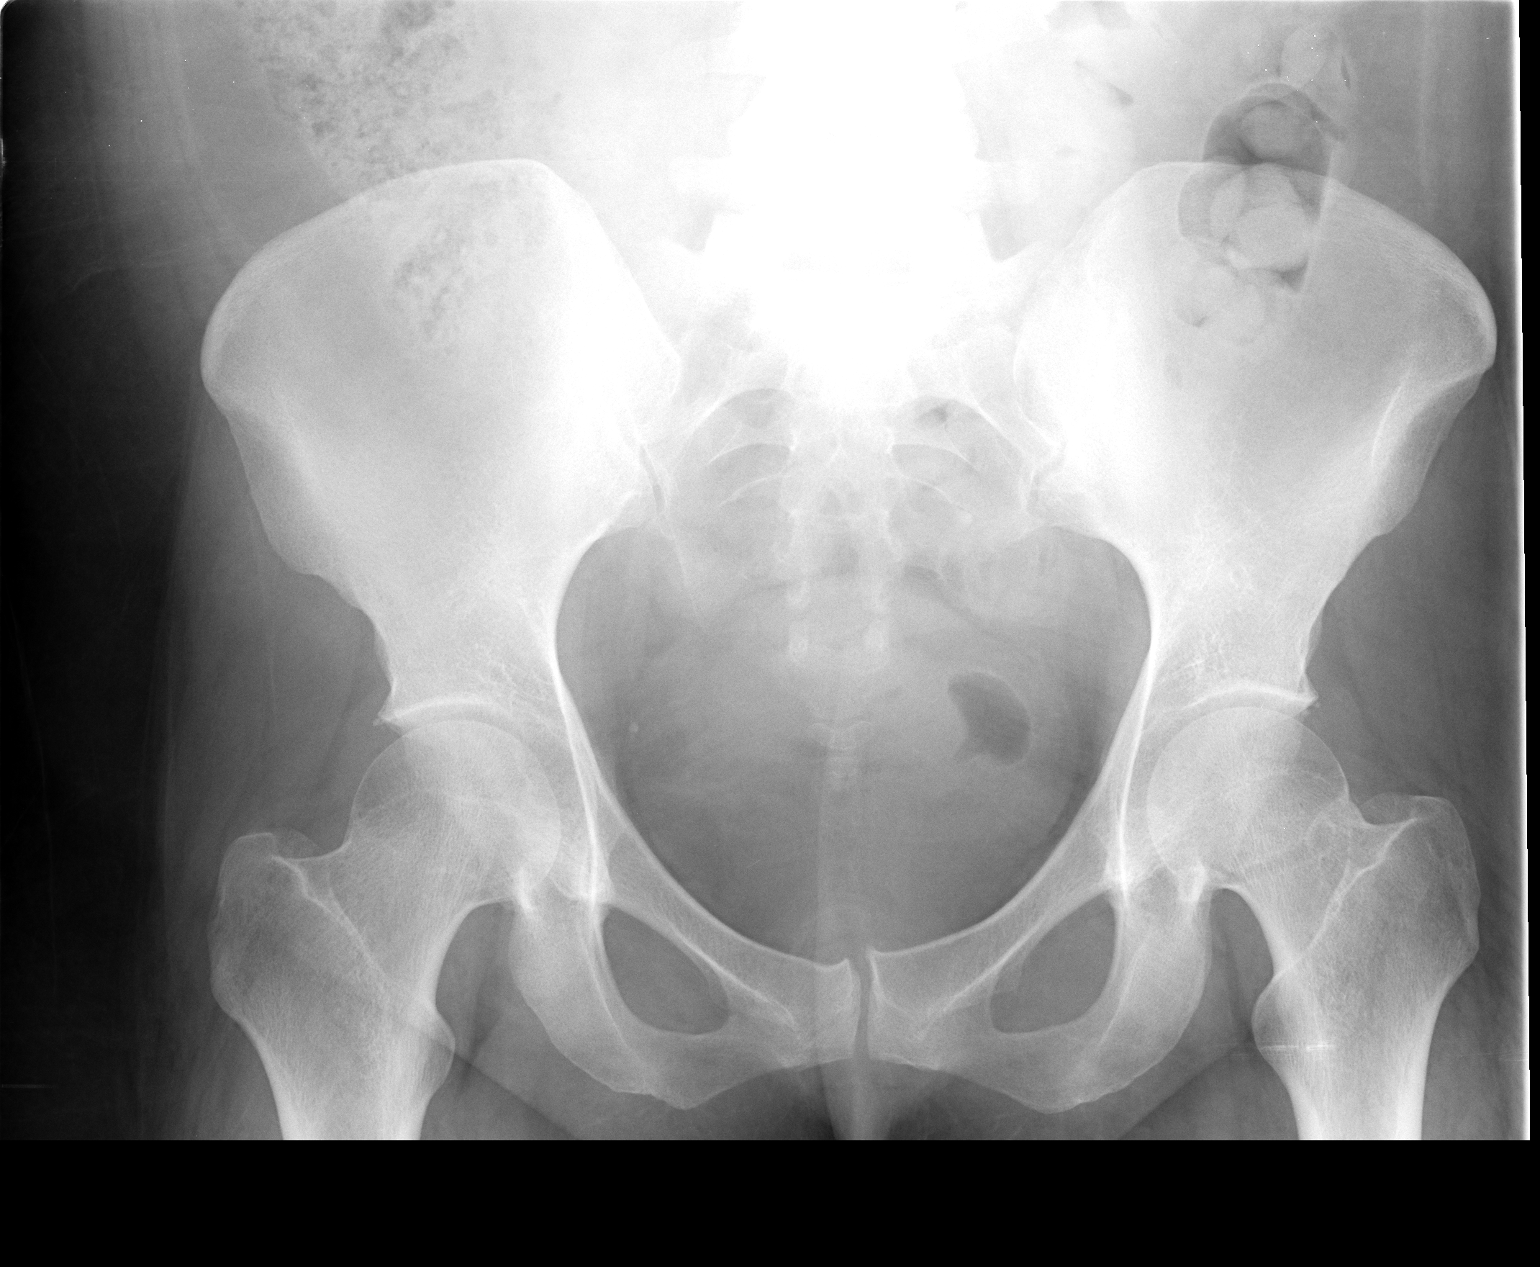

[view not recorded (2 of 3)]
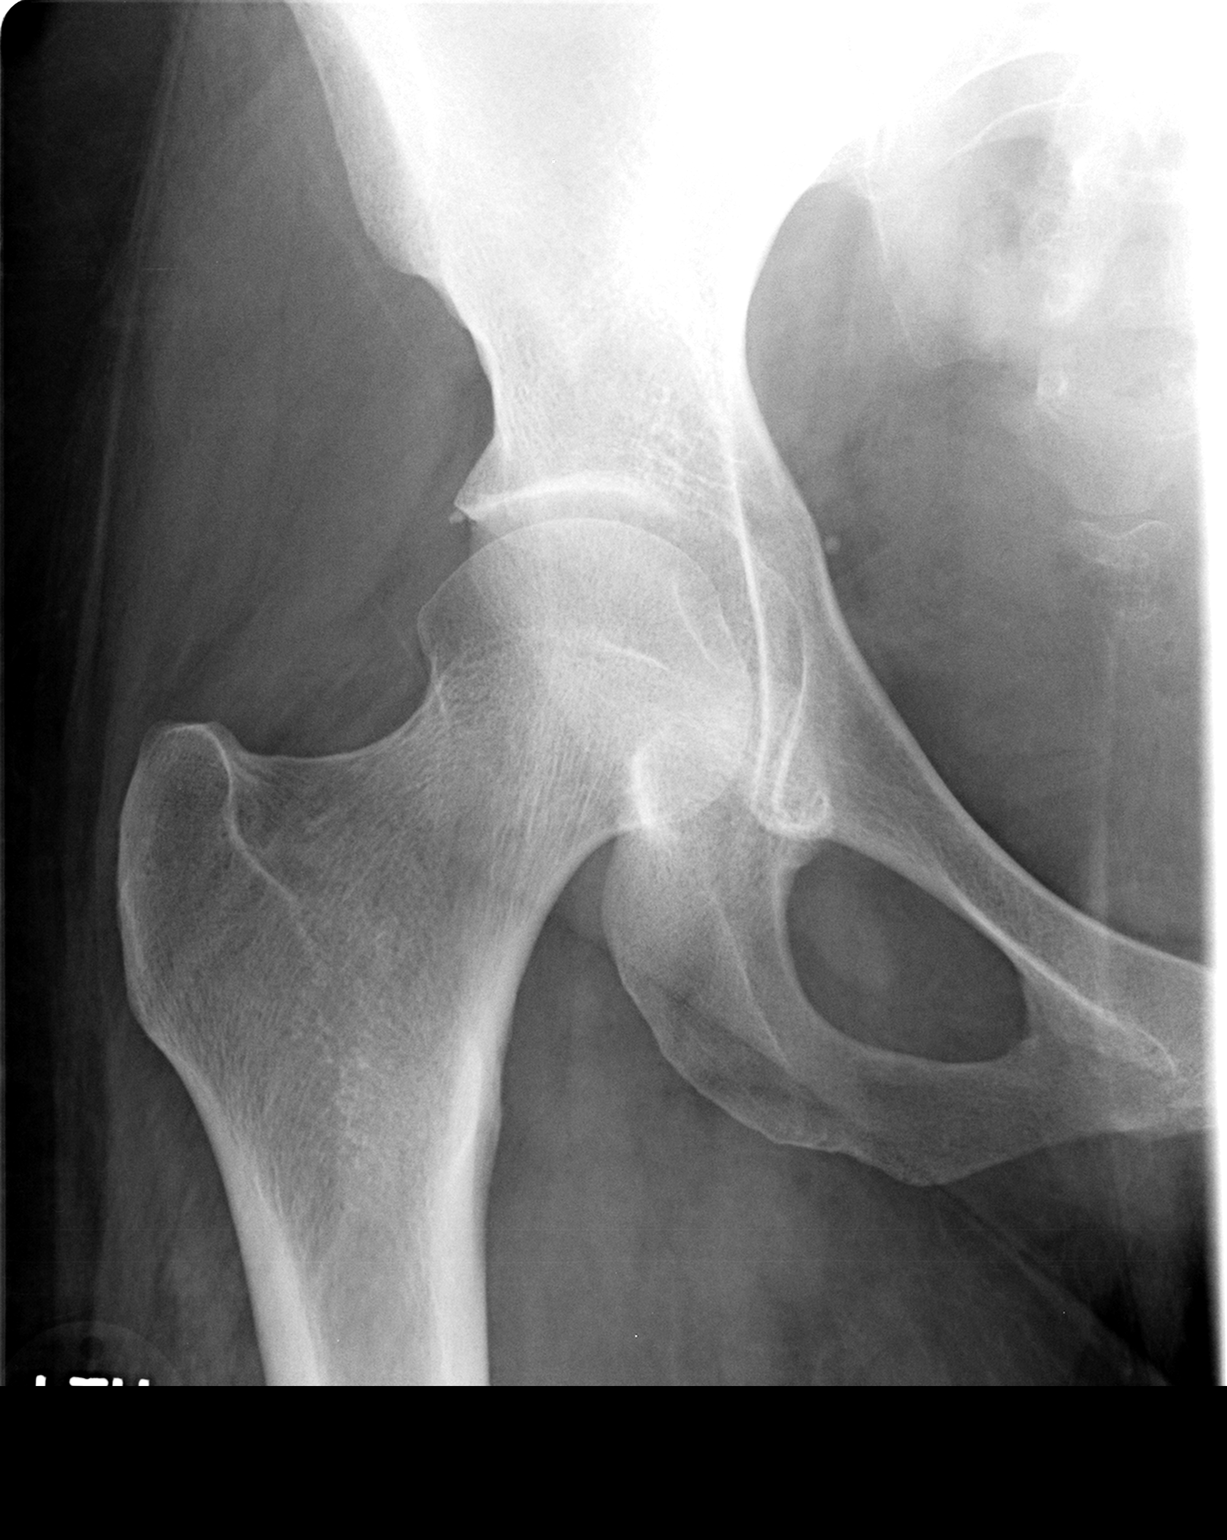

[view not recorded (3 of 3)]
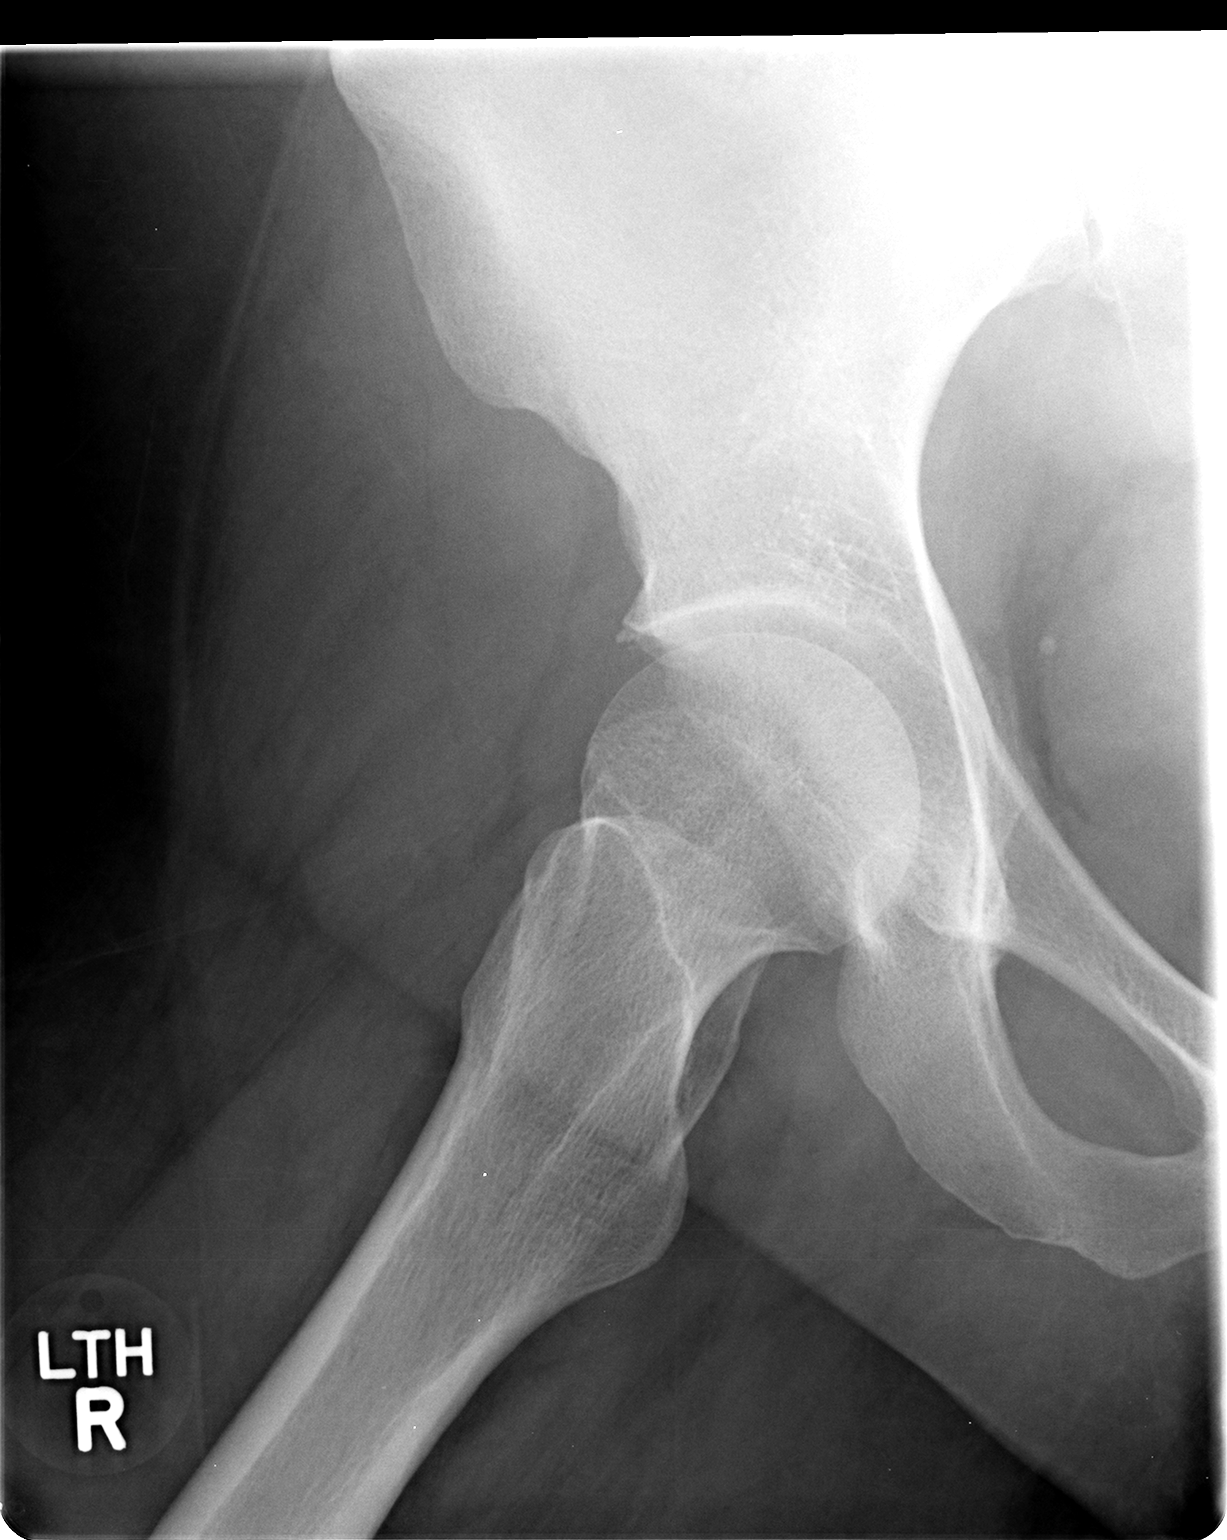

[3 of 3 positions shown; findings below may reference images not displayed]

FINDINGS: There is no fracture, dislocation, or other significant
abnormality.
IMPRESSION: Normal right hip.

## 2012-05-05 ENCOUNTER — Encounter (HOSPITAL_COMMUNITY): Payer: Self-pay

## 2012-05-05 ENCOUNTER — Emergency Department (HOSPITAL_COMMUNITY): Payer: BC Managed Care – PPO

## 2012-05-05 ENCOUNTER — Emergency Department (HOSPITAL_COMMUNITY)
Admission: EM | Admit: 2012-05-05 | Discharge: 2012-05-05 | Disposition: A | Discharge status: Home / Self Care | Payer: BC Managed Care – PPO | Attending: Emergency Medicine | Admitting: Emergency Medicine

## 2012-05-05 DIAGNOSIS — W172XXA Fall into hole, initial encounter: Secondary | ICD-10-CM | POA: Insufficient documentation

## 2012-05-05 DIAGNOSIS — S39012A Strain of muscle, fascia and tendon of lower back, initial encounter: Secondary | ICD-10-CM

## 2012-05-05 DIAGNOSIS — M25559 Pain in unspecified hip: Secondary | ICD-10-CM | POA: Insufficient documentation

## 2012-05-05 DIAGNOSIS — S300XXA Contusion of lower back and pelvis, initial encounter: Secondary | ICD-10-CM

## 2012-05-05 DIAGNOSIS — S93409A Sprain of unspecified ligament of unspecified ankle, initial encounter: Secondary | ICD-10-CM | POA: Insufficient documentation

## 2012-05-05 DIAGNOSIS — S2020XA Contusion of thorax, unspecified, initial encounter: Secondary | ICD-10-CM | POA: Insufficient documentation

## 2012-05-05 DIAGNOSIS — S335XXA Sprain of ligaments of lumbar spine, initial encounter: Secondary | ICD-10-CM | POA: Insufficient documentation

## 2012-05-05 DIAGNOSIS — Z981 Arthrodesis status: Secondary | ICD-10-CM | POA: Insufficient documentation

## 2012-05-05 DIAGNOSIS — S93401A Sprain of unspecified ligament of right ankle, initial encounter: Secondary | ICD-10-CM

## 2012-05-05 MED ORDER — HYDROCODONE-ACETAMINOPHEN 5-325 MG PO TABS
1.0000 | ORAL_TABLET | Freq: Once | ORAL | Status: AC
  Administered 2012-05-05: 1 via ORAL
  Filled 2012-05-05: qty 1

## 2012-05-05 MED ORDER — IBUPROFEN 800 MG PO TABS
800.0000 mg | ORAL_TABLET | Freq: Once | ORAL | Status: AC
  Administered 2012-05-05: 800 mg via ORAL
  Filled 2012-05-05: qty 1

## 2012-05-05 MED ORDER — HYDROCODONE-ACETAMINOPHEN 5-325 MG PO TABS: 1.0000 | ORAL_TABLET | Freq: Four times a day (QID) | ORAL | Status: AC | PRN

## 2012-05-05 NOTE — ED Provider Notes (Signed)
History     CSN: [id number]  Arrival date & time 05/05/12  1954   First MD Initiated Contact with Patient 05/05/12 2015      Chief Complaint  Patient presents with  . Fall  . Back Pain  . Hip Pain    (Consider location/radiation/quality/duration/timing/severity/associated sxs/prior treatment) HPI Comments: Running and playing with her children.  Stepped in a hole and inverted R ankle and then fell onto buttocks.  C/o pain to R pelvis , lower back and R ankle.  Patient is a 31 y.o. female presenting with fall, back pain, and hip pain. The history is provided by the patient. No language interpreter was used.  Fall Incident onset: today. The fall occurred while recreating/playing. She landed on grass. The pain is at a severity of 7/10. She was ambulatory at the scene. There was no entrapment after the fall. There was no drug use involved in the accident. There was no alcohol use involved in the accident. Pertinent negatives include no numbness and no bowel incontinence. The symptoms are aggravated by ambulation. She has tried nothing for the symptoms.  Back Pain  Pertinent negatives include no numbness, no bowel incontinence and no weakness.  Hip Pain Pertinent negatives include no numbness or weakness.    Past Medical History  Diagnosis Date  . Acid reflux   . Chronic back pain   . Renal disorder     kidney stones  . Insomnia     Past Surgical History  Procedure Date  . Foot surgery   . Tubal ligation     History reviewed. No pertinent family history.  History  Substance Use Topics  . Smoking status: Current Everyday Smoker -- 1.0 packs/day  . Smokeless tobacco: Not on file  . Alcohol Use: No    OB History    Grav Para Term Preterm Abortions TAB SAB Ect Mult Living                  Review of Systems  Gastrointestinal: Negative for bowel incontinence.  Musculoskeletal: Positive for back pain.       Ankle inj.  R post pelvis pain and PT.  Neurological:  Negative for weakness and numbness.  All other systems reviewed and are negative.    Allergies  Review of patient's allergies indicates no known allergies.  Home Medications   Current Outpatient Rx  Name Route Sig Dispense Refill  . ACETAMINOPHEN 325 MG PO TABS Oral Take 650 mg by mouth every 6 (six) hours as needed. For pain    . AMITRIPTYLINE HCL 100 MG PO TABS Oral Take 100 mg by mouth at bedtime.    Marland Kitchen BUPRENORPHINE HCL-NALOXONE HCL 8-2 MG SL FILM Sublingual Place 1 Film under the tongue 2 (two) times daily.    Marland Kitchen DOCUSATE SODIUM 100 MG PO CAPS Oral Take 300 mg by mouth daily.    . FUROSEMIDE 20 MG PO TABS Oral Take 20 mg by mouth 2 (two) times daily.    Marland Kitchen HYDROCODONE-ACETAMINOPHEN 5-325 MG PO TABS Oral Take 1 tablet by mouth every 6 (six) hours as needed for pain. 20 tablet 0  . KETOROLAC TROMETHAMINE 10 MG PO TABS Oral Take 10 mg by mouth daily as needed. For pain    . OMEPRAZOLE 20 MG PO CPDR Oral Take 20 mg by mouth 2 (two) times daily.      BP 123/79  Pulse 109  Temp 98.3 F (36.8 C) (Oral)  Resp 16  Ht 5\' 7"  (1.702 m)  Wt 208 lb (94.348 kg)  BMI 32.58 kg/m2  SpO2 100%  LMP 04/09/2012  Physical Exam  Nursing note and vitals reviewed. Constitutional: She is oriented to person, place, and time. She appears well-developed and well-nourished. No distress.  HENT:  Head: Normocephalic and atraumatic.  Eyes: EOM are normal.  Neck: Normal range of motion.  Cardiovascular: Normal rate, regular rhythm and normal heart sounds.   Pulmonary/Chest: Effort normal and breath sounds normal.  Abdominal: Soft. She exhibits no distension. There is no tenderness.  Musculoskeletal: She exhibits tenderness.       Right ankle: She exhibits decreased range of motion. She exhibits no swelling, no ecchymosis, no deformity, no laceration and normal pulse. tenderness. Lateral malleolus tenderness found.       Lumbar back: She exhibits decreased range of motion, tenderness, bony tenderness and  pain. She exhibits no swelling, no edema, no deformity, no laceration, no spasm and normal pulse.       Back:       Arms:      Feet:  Neurological: She is alert and oriented to person, place, and time.  Skin: Skin is warm and dry.  Psychiatric: She has a normal mood and affect. Judgment normal.    ED Course  Procedures (including critical care time)  Labs Reviewed - No data to display Dg Lumbar Spine Complete  05/05/2012  *RADIOLOGY REPORT*  Clinical Data: Fall, lumbosacral pain.  LUMBAR SPINE - COMPLETE 4+ VIEW  Comparison: 03/29/2012 CT  Findings: The imaged vertebral bodies and inter-vertebral disc spaces are maintained. No displaced acute fracture or dislocation identified.   The para-vertebral and overlying soft tissues are within normal limits.  Intact sacroiliac joints.  The lower sacrum is excluded.  IMPRESSION: No acute osseous abnormality of the lumbar spine.  Original Report Authenticated By: Waneta Martins, M.D.   Dg Pelvis 1-2 Views  05/05/2012  *RADIOLOGY REPORT*  Clinical Data: Right greater than left pelvic pain, low back pain, hip pain  PELVIS - 1-2 VIEW  Comparison: 04/18/2010  Findings: Osseous mineralization grossly normal. Symmetric hip and SI joints. Symmetric sacral foramina. No acute fracture, dislocation, or bone destruction.  IMPRESSION: No acute osseous abnormalities.  Original Report Authenticated By: Lollie Marrow, M.D.   Dg Ankle Complete Right  05/05/2012  *RADIOLOGY REPORT*  Clinical Data: Ankle pain, prior ORIF right foot  RIGHT ANKLE - COMPLETE 3+ VIEW  Comparison: 07/02/2006  Findings: Prior Lisfranc joint fusions at first through third TMT joints. Ankle mortise intact. Osseous mineralization normal. No acute fracture, dislocation or bone destruction.  IMPRESSION: Prior foot surgery. No acute right ankle abnormalities.  Original Report Authenticated By: Lollie Marrow, M.D.     1. Right ankle sprain   2. Contusion of pelvis   3. Lumbar strain        MDM  rx hydrocodone, 20 OTC ibuprofen 800 mg TID Ice Crutches F/u with dr. Romeo Apple prn.        Evalina Field, [place] 05/05/12 2206

## 2012-05-05 NOTE — ED Notes (Signed)
Pt back from x-ray.

## 2012-05-05 NOTE — ED Notes (Signed)
Larey Seat this morning and hurt back and right hip, twisted my right ankle per pt.

## 2012-05-05 NOTE — ED Provider Notes (Signed)
Medical screening examination/treatment/procedure(s) were performed by non-physician practitioner and as supervising physician I was immediately available for consultation/collaboration.   Elisha Mcgruder L Nickolaos Brallier, MD 05/05/12 2345 

## 2012-05-05 NOTE — Discharge Instructions (Signed)
Ankle Sprain An ankle sprain is an injury to the strong, fibrous tissues (ligaments) that hold the bones of your ankle joint together.  CAUSES Ankle sprain usually is caused by a fall or by twisting your ankle. People who participate in sports are more prone to these types of injuries.  SYMPTOMS  Symptoms of ankle sprain include:  Pain in your ankle. The pain may be present at rest or only when you are trying to stand or walk.   Swelling.   Bruising. Bruising may develop immediately or within 1 to 2 days after your injury.   Difficulty standing or walking.  DIAGNOSIS  Your caregiver will ask you details about your injury and perform a physical exam of your ankle to determine if you have an ankle sprain. During the physical exam, your caregiver will press and squeeze specific areas of your foot and ankle. Your caregiver will try to move your ankle in certain ways. An X-ray exam may be done to be sure a bone was not broken or a ligament did not separate from one of the bones in your ankle (avulsion).  TREATMENT  Certain types of braces can help stabilize your ankle. Your caregiver can make a recommendation for this. Your caregiver may recommend the use of medication for pain. If your sprain is severe, your caregiver may refer you to a surgeon who helps to restore function to parts of your skeletal system (orthopedist) or a physical therapist. HOME CARE INSTRUCTIONS  Apply ice to your injury for 1 to 2 days or as directed by your caregiver. Applying ice helps to reduce inflammation and pain.  Put ice in a plastic bag.   Place a towel between your skin and the bag.   Leave the ice on for 15 to 20 minutes at a time, every 2 hours while you are awake.   Take over-the-counter or prescription medicines for pain, discomfort, or fever only as directed by your caregiver.   Keep your injured leg elevated, when possible, to lessen swelling.   If your caregiver recommends crutches, use them as  instructed. Gradually, put weight on the affected ankle. Continue to use crutches or a cane until you can walk without feeling pain in your ankle.   If you have a plaster splint, wear the splint as directed by your caregiver. Do not rest it on anything harder than a pillow the first 24 hours. Do not put weight on it. Do not get it wet. You may take it off to take a shower or bath.   You may have been given an elastic bandage to wear around your ankle to provide support. If the elastic bandage is too tight (you have numbness or tingling in your foot or your foot becomes cold and blue), adjust the bandage to make it comfortable.   If you have an air splint, you may blow more air into it or let air out to make it more comfortable. You may take your splint off at night and before taking a shower or bath.   Wiggle your toes in the splint several times per day if you are able.  SEEK MEDICAL CARE IF:   You have an increase in bruising, swelling, or pain.   Your toes feel cold.   Pain relief is not achieved with medication.  SEEK IMMEDIATE MEDICAL CARE IF: Your toes are numb or blue or you have severe pain. MAKE SURE YOU:   Understand these instructions.   Will watch your condition.     Will get help right away if you are not doing well or get worse.  Document Released: 10/24/2005 Document Revised: 10/13/2011 Document Reviewed: 05/28/2008 [employer] Patient Information 2012 [employer].cold Crutch Use You have been prescribed crutches to take weight off one of your lower legs or feet (extremities). When using crutches, make sure you are not putting pressure on the armpit (axilla). This could cause damage to the nerves that extend from your axilla to the hand and arm. When fitted properly the crutches should be 2 to 3 finger widths below the axilla. Your weight should be supported by your hand, and not by resting upon the crutch with the axilla. When walking, first step with the crutches, then  swing the healthy leg through and slightly ahead. When going up stairs, first step up with the healthy leg and then follow with the crutches and injured leg up to the same step, and so forth. If there is a handrail, hold both crutches in one hand, place your other hand on the handrail, and while placing your weight on your arms, lift your good leg to the step, then bring the crutches and the injured leg up to that step. Repeat for each step. When going down stairs, first step with the injured leg and crutches, following down with the healthy leg to the same step. Be very careful, as going down stairs with crutches is very challenging. If you feel wobbly or nervous, sit down and inch yourself down the stairs on your butt. To get up from a chair, hold injured leg forward, grab armrest with one hand and the top of the crutches with the other hand. Using these supports, pull yourself up to a standing position. Reverse this procedure for sitting. See your caregiver for follow up as suggested. If you are discharged in an ace wrap and develop numbness, tingling, swelling, or increased pain, loosen the ace wrap and re-wrap looser. If these problems persist, see your caregiver as needed. If you have been instructed to use partial weight bearing, bear (apply) the amount of weight as suggested by your caregiver. Do not bear weight in an amount that causes pain on the area of injury. Document Released: 10/21/2000 Document Revised: 10/13/2011 Document Reviewed: 12/29/2008 [facility] Patient Information 2012 [place], [place].Cryotherapy Cryotherapy means treatment with cold. Ice or gel packs can be used to reduce both pain and swelling. Ice is the most helpful within the first 24 to 48 hours after an injury or flareup from overusing a muscle or joint. Sprains, strains, spasms, burning pain, shooting pain, and aches can all be eased with ice. Ice can also be used when recovering from surgery. Ice is effective, has very few  side effects, and is safe for most people to use. PRECAUTIONS  Ice is not a safe treatment option for people with:  Raynaud's phenomenon. This is a condition affecting small blood vessels in the extremities. Exposure to cold may cause your problems to return.   Cold hypersensitivity. There are many forms of cold hypersensitivity, including:   Cold urticaria. Red, itchy hives appear on the skin when the tissues begin to warm after being iced.   Cold erythema. This is a red, itchy rash caused by exposure to cold.   Cold hemoglobinuria. Red blood cells break down when the tissues begin to warm after being iced. The hemoglobin that carry oxygen are passed into the urine because they cannot combine with blood proteins fast enough.   Numbness or altered sensitivity in the area being  iced.  If you have any of the following conditions, do not use ice until you have discussed cryotherapy with your caregiver:  Heart conditions, such as arrhythmia, angina, or chronic heart disease.   High blood pressure.   Healing wounds or open skin in the area being iced.   Current infections.   Rheumatoid arthritis.   Poor circulation.   Diabetes.  Ice slows the blood flow in the region it is applied. This is beneficial when trying to stop inflamed tissues from spreading irritating chemicals to surrounding tissues. However, if you expose your skin to cold temperatures for too long or without the proper protection, you can damage your skin or nerves. Watch for signs of skin damage due to cold. HOME CARE INSTRUCTIONS Follow these tips to use ice and cold packs safely.  Place a dry or damp towel between the ice and skin. A damp towel will cool the skin more quickly, so you may need to shorten the time that the ice is used.   For a more rapid response, add gentle compression to the ice.   Ice for no more than 10 to 20 minutes at a time. The bonier the area you are icing, the less time it will take to get  the benefits of ice.   Check your skin after 5 minutes to make sure there are no signs of a poor response to cold or skin damage.   Rest 20 minutes or more in between uses.   Once your skin is numb, you can end your treatment. You can test numbness by very lightly touching your skin. The touch should be so light that you do not see the skin dimple from the pressure of your fingertip. When using ice, most people will feel these normal sensations in this order: cold, burning, aching, and numbness.   Do not use ice on someone who cannot communicate their responses to pain, such as small children or people with dementia.  HOW TO MAKE AN ICE PACK Ice packs are the most common way to use ice therapy. Other methods include ice massage, ice baths, and cryo-sprays. Muscle creams that cause a cold, tingly feeling do not offer the same benefits that ice offers and should not be used as a substitute unless recommended by your caregiver. To make an ice pack, do one of the following:  Place crushed ice or a bag of frozen vegetables in a sealable plastic bag. Squeeze out the excess air. Place this bag inside another plastic bag. Slide the bag into a pillowcase or place a damp towel between your skin and the bag.   Mix 3 parts water with 1 part rubbing alcohol. Freeze the mixture in a sealable plastic bag. When you remove the mixture from the freezer, it will be slushy. Squeeze out the excess air. Place this bag inside another plastic bag. Slide the bag into a pillowcase or place a damp towel between your skin and the bag.  SEEK MEDICAL CARE IF:  You develop white spots on your skin. This may give the skin a blotchy (mottled) appearance.   Your skin turns blue or pale.   Your skin becomes waxy or hard.   Your swelling gets worse.  MAKE SURE YOU:   Understand these instructions.   Will watch your condition.   Will get help right away if you are not doing well or get worse.  Document Released:  06/20/2011 Document Revised: 10/13/2011 Document Reviewed: 06/20/2011 [facility] Patient Information 2012 [place], [place].Muscle  Strain A muscle strain, or pulled muscle, occurs when a muscle is over-stretched. A small number of muscle fibers may also be torn. This is especially common in athletes. This happens when a sudden violent force placed on a muscle pushes it past its capacity. Usually, recovery from a pulled muscle takes 1 to 2 weeks. But complete healing will take 5 to 6 weeks. There are millions of muscle fibers. Following injury, your body will usually return to normal quickly. HOME CARE INSTRUCTIONS   While awake, apply ice to the sore muscle for 15 to 20 minutes each hour for the first 2 days. Put ice in a plastic bag and place a towel between the bag of ice and your skin.   Do not use the pulled muscle for several days. Do not use the muscle if you have pain.   You may wrap the injured area with an elastic bandage for comfort. Be careful not to bind it too tightly. This may interfere with blood circulation.   Only take over-the-counter or prescription medicines for pain, discomfort, or fever as directed by your caregiver. Do not use aspirin as this will increase bleeding (bruising) at injury site.   Warming up before exercise helps prevent muscle strains.  SEEK MEDICAL CARE IF:  There is increased pain or swelling in the affected area. MAKE SURE YOU:   Understand these instructions.   Will watch your condition.   Will get help right away if you are not doing well or get worse.  Document Released: 10/24/2005 Document Revised: 10/13/2011 Document Reviewed: 05/23/2007 [facility] Patient Information 2012 [place], [place].   Take the pain medicine as directed.  Take ibuprofen 800 mg every 8 hrs with food.  Apply ice several times daily to areas of soreness.  Wear the ASO splint for 2-3 weeks.  Use the crutches as needed and bear weight as tolerated.  Follow up with dr. Romeo Apple as  needed.

## 2012-06-02 ENCOUNTER — Emergency Department (HOSPITAL_COMMUNITY)
Admission: EM | Admit: 2012-06-02 | Discharge: 2012-06-02 | Disposition: A | Discharge status: Home / Self Care | Payer: BC Managed Care – PPO | Attending: Emergency Medicine | Admitting: Emergency Medicine

## 2012-06-02 ENCOUNTER — Encounter (HOSPITAL_COMMUNITY): Payer: Self-pay | Admitting: Emergency Medicine

## 2012-06-02 ENCOUNTER — Emergency Department (HOSPITAL_COMMUNITY): Payer: BC Managed Care – PPO

## 2012-06-02 DIAGNOSIS — S5000XA Contusion of unspecified elbow, initial encounter: Secondary | ICD-10-CM | POA: Insufficient documentation

## 2012-06-02 DIAGNOSIS — M25529 Pain in unspecified elbow: Secondary | ICD-10-CM | POA: Insufficient documentation

## 2012-06-02 DIAGNOSIS — W19XXXA Unspecified fall, initial encounter: Secondary | ICD-10-CM | POA: Insufficient documentation

## 2012-06-02 DIAGNOSIS — S5011XA Contusion of right forearm, initial encounter: Secondary | ICD-10-CM

## 2012-06-02 MED ORDER — HYDROCODONE-ACETAMINOPHEN 5-325 MG PO TABS
1.0000 | ORAL_TABLET | Freq: Once | ORAL | Status: AC
  Administered 2012-06-02: 1 via ORAL
  Filled 2012-06-02: qty 1

## 2012-06-02 MED ORDER — HYDROCODONE-ACETAMINOPHEN 5-325 MG PO TABS: 1.0000 | ORAL_TABLET | ORAL | Status: AC | PRN

## 2012-06-02 NOTE — ED Notes (Signed)
Patient reports falling today on right forearm. Complaining of pain and swelling to arm. bruising noted to right forearm.

## 2012-06-03 NOTE — ED Provider Notes (Signed)
History     CSN: [id number]  Arrival date & time 06/02/12  2025   First MD Initiated Contact with Patient 06/02/12 2040      Chief Complaint  Patient presents with  . Arm Injury    (Consider location/radiation/quality/duration/timing/severity/associated sxs/prior treatment) HPI Comments: April Boyer slipped on wet pavement today, hitting her right forearm against pavement.  She continues to have throbbing pain which is constant along with swelling and bruising along her lateral arm.  She has taken tylenol without relief.  She denies numbness or loss of movement in her hand or fingers.  She denies other injury, and did not hit her head during the fall.  The history is provided by the patient.    Past Medical History  Diagnosis Date  . Acid reflux   . Chronic back pain   . Renal disorder     kidney stones  . Insomnia     Past Surgical History  Procedure Date  . Foot surgery   . Tubal ligation     History reviewed. No pertinent family history.  History  Substance Use Topics  . Smoking status: Current Everyday Smoker -- 1.0 packs/day  . Smokeless tobacco: Not on file  . Alcohol Use: No    OB History    Grav Para Term Preterm Abortions TAB SAB Ect Mult Living                  Review of Systems  Musculoskeletal: Positive for joint swelling and arthralgias.  Skin: Negative for wound.  Neurological: Negative for weakness and numbness.    Allergies  Review of patient's allergies indicates no known allergies.  Home Medications   Current Outpatient Rx  Name Route Sig Dispense Refill  . ACETAMINOPHEN 325 MG PO TABS Oral Take 650 mg by mouth every 6 (six) hours as needed. For pain    . AMITRIPTYLINE HCL 100 MG PO TABS Oral Take 100 mg by mouth at bedtime.    Marland Kitchen BUPRENORPHINE HCL-NALOXONE HCL 8-2 MG SL FILM Sublingual Place 1 Film under the tongue 2 (two) times daily.    Marland Kitchen DOCUSATE SODIUM 100 MG PO CAPS Oral Take 300 mg by mouth daily.    . FUROSEMIDE 20 MG PO  TABS Oral Take 20 mg by mouth 2 (two) times daily.    Marland Kitchen HYDROCODONE-ACETAMINOPHEN 5-325 MG PO TABS Oral Take 1 tablet by mouth every 4 (four) hours as needed for pain. 20 tablet 0  . KETOROLAC TROMETHAMINE 10 MG PO TABS Oral Take 10 mg by mouth daily as needed. For pain    . OMEPRAZOLE 20 MG PO CPDR Oral Take 20 mg by mouth 2 (two) times daily.      BP 135/95  Pulse 92  Temp 98.3 F (36.8 C) (Oral)  Resp 20  Ht 5\' 7"  (1.702 m)  Wt 208 lb (94.348 kg)  BMI 32.58 kg/m2  SpO2 96%  LMP 06/02/2012  Physical Exam  Constitutional: She appears well-developed and well-nourished.  HENT:  Head: Atraumatic.  Neck: Normal range of motion.  Cardiovascular:  Pulses:      Radial pulses are 2+ on the right side, and 2+ on the left side.       Pulses equal bilaterally. Less than 3 sec cap refill right digits.  Musculoskeletal: She exhibits edema and tenderness.       Right forearm: She exhibits tenderness and edema. She exhibits no deformity.       Modest edema at right lateral distal  forearm along with several scattered bruises along lateral edge of forearm.    Neurological: She is alert. She has normal strength. She displays normal reflexes. No sensory deficit.       Equal strength  Skin: Skin is warm and dry.  Psychiatric: She has a normal mood and affect.    ED Course  Procedures (including critical care time)  Labs Reviewed - No data to display Dg Forearm Right  06/02/2012  *RADIOLOGY REPORT*  Clinical Data: Distal arm pain status post fall.  RIGHT FOREARM - 2 VIEW  Comparison: 08/24/2004  Findings: No fracture of the shaft of the right forearm identified. Note that this examination is not optimized evaluate the joint space.  No displacement or joint effusion is identified.  IMPRESSION: No acute osseous abnormality of the right forearm.  Original Report Authenticated By: Waneta Martins, M.D.     1. Contusion of right forearm       MDM  xrays reviewed.  Jones dressing,  Sling  applied.  Discussed suboxone on med list - pt reports was prescribed this over 8 months ago to help with etoh withdrawal sx.  She is not currently taking this medicine and reports has never abused narcotics meds.  States does not tolerate nsaids given gerd.  Tylenol taken today did not relieve pain. Given short course of hydrocodone with precautions given re sedation.  Pt to f/u with pcp if sx persist.        Burgess Amor, PA 06/03/12 1355

## 2012-06-04 NOTE — ED Provider Notes (Signed)
Medical screening examination/treatment/procedure(s) were performed by non-physician practitioner and as supervising physician I was immediately available for consultation/collaboration.   Maxten Shuler III, MD 06/04/12 1357 

## 2012-09-11 ENCOUNTER — Encounter (HOSPITAL_COMMUNITY): Payer: Self-pay | Admitting: *Deleted

## 2012-09-11 ENCOUNTER — Emergency Department (HOSPITAL_COMMUNITY)
Admission: EM | Admit: 2012-09-11 | Discharge: 2012-09-11 | Disposition: A | Discharge status: Home / Self Care | Payer: BC Managed Care – PPO | Attending: Emergency Medicine | Admitting: Emergency Medicine

## 2012-09-11 ENCOUNTER — Emergency Department (HOSPITAL_COMMUNITY): Payer: BC Managed Care – PPO

## 2012-09-11 DIAGNOSIS — Z79899 Other long term (current) drug therapy: Secondary | ICD-10-CM | POA: Insufficient documentation

## 2012-09-11 DIAGNOSIS — G8929 Other chronic pain: Secondary | ICD-10-CM | POA: Insufficient documentation

## 2012-09-11 DIAGNOSIS — G4700 Insomnia, unspecified: Secondary | ICD-10-CM | POA: Insufficient documentation

## 2012-09-11 DIAGNOSIS — F17200 Nicotine dependence, unspecified, uncomplicated: Secondary | ICD-10-CM | POA: Insufficient documentation

## 2012-09-11 DIAGNOSIS — Z87442 Personal history of urinary calculi: Secondary | ICD-10-CM | POA: Insufficient documentation

## 2012-09-11 DIAGNOSIS — K219 Gastro-esophageal reflux disease without esophagitis: Secondary | ICD-10-CM | POA: Insufficient documentation

## 2012-09-11 DIAGNOSIS — N390 Urinary tract infection, site not specified: Secondary | ICD-10-CM | POA: Insufficient documentation

## 2012-09-11 LAB — URINALYSIS, ROUTINE W REFLEX MICROSCOPIC
Nitrite: NEGATIVE
Specific Gravity, Urine: >1.03 — ABNORMAL HIGH (ref 1.005–1.030)
Urobilinogen, UA: 0.2 mg/dL (ref 0.0–1.0)

## 2012-09-11 LAB — URINE MICROSCOPIC-ADD ON

## 2012-09-11 MED ORDER — CEFTRIAXONE SODIUM 250 MG IJ SOLR
250.0000 mg | Freq: Once | INTRAMUSCULAR | Status: AC
  Administered 2012-09-11: 250 mg via INTRAMUSCULAR
  Filled 2012-09-11: qty 250

## 2012-09-11 MED ORDER — SODIUM CHLORIDE 0.9 % IV BOLUS (SEPSIS)
1000.0000 mL | Freq: Once | INTRAVENOUS | Status: AC
  Administered 2012-09-11: 1000 mL via INTRAVENOUS

## 2012-09-11 MED ORDER — HYDROMORPHONE HCL PF 1 MG/ML IJ SOLN
1.0000 mg | Freq: Once | INTRAMUSCULAR | Status: AC
  Administered 2012-09-11: 1 mg via INTRAVENOUS
  Filled 2012-09-11: qty 1

## 2012-09-11 MED ORDER — PROMETHAZINE HCL 25 MG PO TABS: 25.0000 mg | ORAL_TABLET | Freq: Four times a day (QID) | ORAL | Status: DC | PRN

## 2012-09-11 MED ORDER — NITROFURANTOIN MONOHYD MACRO 100 MG PO CAPS: 100.0000 mg | ORAL_CAPSULE | Freq: Two times a day (BID) | ORAL | Status: DC

## 2012-09-11 MED ORDER — ONDANSETRON HCL 4 MG/2ML IJ SOLN
4.0000 mg | Freq: Once | INTRAMUSCULAR | Status: AC
  Administered 2012-09-11: 4 mg via INTRAVENOUS
  Filled 2012-09-11: qty 2

## 2012-09-11 MED ORDER — OXYCODONE-ACETAMINOPHEN 5-325 MG PO TABS: 1.0000 | ORAL_TABLET | Freq: Four times a day (QID) | ORAL | Status: DC | PRN

## 2012-09-11 NOTE — ED Notes (Signed)
Pain in right flank radiating into right lower quardant

## 2012-09-11 NOTE — ED Provider Notes (Signed)
History     CSN: [id number]  Arrival date & time 09/11/12  7829   First MD Initiated Contact with Patient 09/11/12 1946      Chief Complaint  Patient presents with  . Flank Pain    (Consider location/radiation/quality/duration/timing/severity/associated sxs/prior treatment) HPI....  flank pain with radiation to right lower quadrant or 3 days.  Patient evaluated in the emergency department on Sunday in [place], [place] with a negative CT, ultrasound, Labs.  She's been urinating blood.  No fevers, sweats, chills. Severity is moderate. Pain is sharp.  Past Medical History  Diagnosis Date  . Acid reflux   . Chronic back pain   . Renal disorder     kidney stones  . Insomnia     Past Surgical History  Procedure Date  . Foot surgery   . Tubal ligation     No family history on file.  History  Substance Use Topics  . Smoking status: Current Every Day Smoker -- 1.0 packs/day  . Smokeless tobacco: Not on file  . Alcohol Use: No    OB History    Grav Para Term Preterm Abortions TAB SAB Ect Mult Living                  Review of Systems  All other systems reviewed and are negative.    Allergies  Review of patient's allergies indicates no known allergies.  Home Medications   Current Outpatient Rx  Name  Route  Sig  Dispense  Refill  . ACETAMINOPHEN 325 MG PO TABS   Oral   Take 650-975 mg by mouth every 6 (six) hours as needed. For pain         . HYDROCODONE-ACETAMINOPHEN 5-325 MG PO TABS   Oral   Take 1 tablet by mouth every 6 (six) hours as needed. For pain         . NITROFURANTOIN MONOHYD MACRO 100 MG PO CAPS   Oral   Take 1 capsule (100 mg total) by mouth 2 (two) times daily. X 7 days   20 capsule   0   . OXYCODONE-ACETAMINOPHEN 5-325 MG PO TABS   Oral   Take 1-2 tablets by mouth every 6 (six) hours as needed for pain.   20 tablet   0   . PROMETHAZINE HCL 25 MG PO TABS   Oral   Take 1 tablet (25 mg total) by mouth every 6 (six) hours as  needed for nausea.   10 tablet   0   . PROMETHAZINE HCL 25 MG PO TABS   Oral   Take 1 tablet (25 mg total) by mouth every 6 (six) hours as needed for nausea.   15 tablet   0     BP 138/97  Pulse 96  Temp 98 F (36.7 C) (Oral)  Resp 20  Ht 5\' 7"  (1.702 m)  Wt 214 lb (97.07 kg)  BMI 33.52 kg/m2  SpO2 100%  LMP 09/01/2012  Physical Exam  Nursing note and vitals reviewed. Constitutional: She is oriented to person, place, and time. She appears well-developed and well-nourished.  HENT:  Head: Normocephalic and atraumatic.  Eyes: Conjunctivae normal and EOM are normal. Pupils are equal, round, and reactive to light.  Neck: Normal range of motion. Neck supple.  Cardiovascular: Normal rate, regular rhythm and normal heart sounds.   Pulmonary/Chest: Effort normal and breath sounds normal.  Abdominal: Soft. Bowel sounds are normal.  Genitourinary:       Right flank tenderness  Musculoskeletal: Normal range of motion.  Neurological: She is alert and oriented to person, place, and time.  Skin: Skin is warm and dry.  Psychiatric: She has a normal mood and affect.    ED Course  Procedures (including critical care time)  Labs Reviewed  URINALYSIS, ROUTINE W REFLEX MICROSCOPIC - Abnormal; Notable for the following:    Specific Gravity, Urine >1.030 (*)     Hgb urine dipstick LARGE (*)     Protein, ur TRACE (*)     Leukocytes, UA SMALL (*)     All other components within normal limits  URINE MICROSCOPIC-ADD ON - Abnormal; Notable for the following:    Squamous Epithelial / LPF MANY (*)     Bacteria, UA MANY (*)     All other components within normal limits  URINE CULTURE   Dg Abd 1 View  09/11/2012  *RADIOLOGY REPORT*  Clinical Data: Right-sided flank pain, evaluate for renal stone  ABDOMEN - 1 VIEW  Comparison: CT abdomen pelvis - 03/29/2012  Findings:  No definite abnormal intra-abdominal calcifications.  High density material is seen within the distal sigmoid colon and  rectum.  Nonobstructive bowel gas pattern.  Evaluation for pneumoperitoneum is degraded secondary to supine patient positioning and exclusion of the lower thorax.  No definite pneumatosis or portal venous gas.  No acute osseous abnormalities.  IMPRESSION: No definite abnormal intra-abdominal calcifications to suggest the presence of nephrolithiasis.   Original Report Authenticated By: Tacey Ruiz, MD      1. Urinary tract infection       MDM  History and physical consistent with early pyelonephritis. She is hemodynamically stable. IV Rocephin, urine culture, hydration. Discharge with Macrobid for 10 days, medication for pain and nausea. Patient is to follow up with urologist on Thursday.        Donnetta Hutching, MD 09/11/12 2142

## 2012-09-11 NOTE — ED Notes (Signed)
Pt c/o pain in right flank. Reports pain began on Sunday, and she was seen at [facility] at that time.  She was informed that she may possibly have kidney stones, and has an appointment with Urology on Thursday.  Pt states that pain has increased since that time.

## 2012-09-12 LAB — URINE CULTURE

## 2013-01-06 ENCOUNTER — Emergency Department (HOSPITAL_COMMUNITY): Payer: BC Managed Care – PPO

## 2013-01-06 ENCOUNTER — Encounter (HOSPITAL_COMMUNITY): Payer: Self-pay

## 2013-01-06 ENCOUNTER — Emergency Department (HOSPITAL_COMMUNITY)
Admission: EM | Admit: 2013-01-06 | Discharge: 2013-01-07 | Disposition: A | Discharge status: Home / Self Care | Payer: BC Managed Care – PPO | Attending: Emergency Medicine | Admitting: Emergency Medicine

## 2013-01-06 DIAGNOSIS — R21 Rash and other nonspecific skin eruption: Secondary | ICD-10-CM | POA: Insufficient documentation

## 2013-01-06 DIAGNOSIS — R300 Dysuria: Secondary | ICD-10-CM | POA: Insufficient documentation

## 2013-01-06 DIAGNOSIS — R1031 Right lower quadrant pain: Secondary | ICD-10-CM | POA: Insufficient documentation

## 2013-01-06 DIAGNOSIS — Z87442 Personal history of urinary calculi: Secondary | ICD-10-CM | POA: Insufficient documentation

## 2013-01-06 DIAGNOSIS — Z8719 Personal history of other diseases of the digestive system: Secondary | ICD-10-CM | POA: Insufficient documentation

## 2013-01-06 DIAGNOSIS — G8929 Other chronic pain: Secondary | ICD-10-CM | POA: Insufficient documentation

## 2013-01-06 DIAGNOSIS — J3489 Other specified disorders of nose and nasal sinuses: Secondary | ICD-10-CM | POA: Insufficient documentation

## 2013-01-06 DIAGNOSIS — R109 Unspecified abdominal pain: Secondary | ICD-10-CM

## 2013-01-06 DIAGNOSIS — M545 Low back pain, unspecified: Secondary | ICD-10-CM | POA: Insufficient documentation

## 2013-01-06 DIAGNOSIS — N898 Other specified noninflammatory disorders of vagina: Secondary | ICD-10-CM | POA: Insufficient documentation

## 2013-01-06 DIAGNOSIS — R197 Diarrhea, unspecified: Secondary | ICD-10-CM | POA: Insufficient documentation

## 2013-01-06 DIAGNOSIS — R112 Nausea with vomiting, unspecified: Secondary | ICD-10-CM | POA: Insufficient documentation

## 2013-01-06 DIAGNOSIS — F17200 Nicotine dependence, unspecified, uncomplicated: Secondary | ICD-10-CM | POA: Insufficient documentation

## 2013-01-06 LAB — CBC WITH DIFFERENTIAL/PLATELET
HCT: 37.6 % (ref 36.0–46.0)
Hemoglobin: 12.1 g/dL (ref 12.0–15.0)
Lymphocytes Relative: 41 % (ref 12–46)
Lymphs Abs: 4.3 10*3/uL — ABNORMAL HIGH (ref 0.7–4.0)
MCHC: 32.2 g/dL (ref 30.0–36.0)
Monocytes Absolute: 0.6 10*3/uL (ref 0.1–1.0)
Monocytes Relative: 6 % (ref 3–12)
Neutro Abs: 5.3 10*3/uL (ref 1.7–7.7)
RBC: 4.56 MIL/uL (ref 3.87–5.11)
WBC: 10.4 10*3/uL (ref 4.0–10.5)

## 2013-01-06 LAB — COMPREHENSIVE METABOLIC PANEL
ALT: 12 U/L (ref 0–35)
Calcium: 9.4 mg/dL (ref 8.4–10.5)
Creatinine, Ser: 0.68 mg/dL (ref 0.50–1.10)
GFR calc Af Amer: >90 mL/min (ref >90)
Glucose, Bld: 114 mg/dL — ABNORMAL HIGH (ref 70–99)
Sodium: 134 mEq/L — ABNORMAL LOW (ref 135–145)
Total Protein: 7.2 g/dL (ref 6.0–8.3)

## 2013-01-06 LAB — URINALYSIS, ROUTINE W REFLEX MICROSCOPIC
Bilirubin Urine: NEGATIVE
Nitrite: NEGATIVE
Specific Gravity, Urine: >1.03 — ABNORMAL HIGH (ref 1.005–1.030)
Urobilinogen, UA: 0.2 mg/dL (ref 0.0–1.0)

## 2013-01-06 MED ORDER — IOHEXOL 300 MG/ML  SOLN
50.0000 mL | Freq: Once | INTRAMUSCULAR | Status: AC | PRN
  Administered 2013-01-06: 50 mL via ORAL

## 2013-01-06 MED ORDER — IOHEXOL 300 MG/ML  SOLN
100.0000 mL | Freq: Once | INTRAMUSCULAR | Status: AC | PRN
  Administered 2013-01-06: 100 mL via INTRAVENOUS

## 2013-01-06 MED ORDER — SODIUM CHLORIDE 0.9 % IV BOLUS (SEPSIS)
1000.0000 mL | Freq: Once | INTRAVENOUS | Status: AC
  Administered 2013-01-06: 1000 mL via INTRAVENOUS

## 2013-01-06 MED ORDER — SODIUM CHLORIDE 0.9 % IV SOLN: INTRAVENOUS | Status: DC

## 2013-01-06 MED ORDER — ONDANSETRON HCL 4 MG/2ML IJ SOLN
4.0000 mg | Freq: Once | INTRAMUSCULAR | Status: AC
  Administered 2013-01-06: 4 mg via INTRAVENOUS
  Filled 2013-01-06: qty 2

## 2013-01-06 MED ORDER — HYDROMORPHONE HCL PF 1 MG/ML IJ SOLN
1.0000 mg | Freq: Once | INTRAMUSCULAR | Status: AC
  Administered 2013-01-06: 1 mg via INTRAVENOUS
  Filled 2013-01-06: qty 1

## 2013-01-06 NOTE — ED Notes (Signed)
Pt c/o pain in right lower abd and into right lower back x 2 days with vomiting and diarrhea

## 2013-01-06 NOTE — ED Provider Notes (Signed)
History  This chart was scribed for April Jakes, MD by April Boyer, ED Scribe. This patient was seen in room APA19/AP[facility]19 and the patient's care was started at 8:41 PM.  CSN: [id number]  Arrival date & time 01/06/13  2007   First MD Initiated Contact with Patient 01/06/13 2041      Chief Complaint  Patient presents with  . Abdominal Pain  . Emesis  . Diarrhea     Patient is a 32 y.o. female presenting with back pain. The history is provided by the patient. No language interpreter was used.  Back Pain Location:  Lumbar spine Quality: sharp. Radiates to: RLQ. Onset quality:  Gradual Duration:  2 days Timing:  Constant Progression:  Worsening Chronicity:  New Associated symptoms: abdominal pain and dysuria   Associated symptoms: no chest pain, no fever, no headaches and no weakness     April Boyer is a 32 y.o. female who presents to the Emergency Department complaining of 2 days of gradual onset, gradually worsening, constant right lower back pain described as sharp that started radiating into the RLQ this morning with associated dysuria, non-bloody emesis and non-bloody diarrhea that started yesterday. She rates her pain a 7 out of 10 currently. She has a h/o kidney stones but denies similarities to prior episodes. She reports a yeast infection with an associated vaginal rash and discharge that she is following up with her PCP currently but denies changes. She denies CP, fevers, SOB and cough as associated symptoms. She also has a h/o GERD and is a current everyday smoker but denies alcohol use.  PCP is Dr. Byrd Hesselbach  Past Medical History  Diagnosis Date  . Acid reflux   . Chronic back pain   . Renal disorder     kidney stones  . Insomnia     Past Surgical History  Procedure Laterality Date  . Foot surgery    . Tubal ligation      No family history on file.  History  Substance Use Topics  . Smoking status: Current Every Day Smoker -- 1.00 packs/day  .  Smokeless tobacco: Not on file  . Alcohol Use: No    No OB history provided.  Review of Systems  Constitutional: Negative for fever and chills.  HENT: Positive for congestion (chest). Negative for sore throat, rhinorrhea and neck pain.   Eyes: Negative for visual disturbance.  Respiratory: Negative for cough and shortness of breath.   Cardiovascular: Negative for chest pain.  Gastrointestinal: Positive for nausea, vomiting, abdominal pain and diarrhea. Negative for blood in stool.  Genitourinary: Positive for dysuria and vaginal discharge (related to her yeast infection). Negative for frequency, hematuria and vaginal bleeding.  Musculoskeletal: Positive for back pain.  Skin: Positive for rash (related to her yeast infection).  Neurological: Negative for weakness and headaches.  Hematological: Does not bruise/bleed easily.  All other systems reviewed and are negative.    Allergies  Review of patient's allergies indicates no known allergies.  Home Medications   Current Outpatient Rx  Name  Route  Sig  Dispense  Refill  . acetaminophen (PAIN RELIEVER) 325 MG tablet   Oral   Take 650-975 mg by mouth every 6 (six) hours as needed. For pain         . HYDROcodone-acetaminophen (NORCO/VICODIN) 5-325 MG per tablet   Oral   Take 1 tablet by mouth every 6 (six) hours as needed. For pain         . HYDROcodone-acetaminophen (NORCO/VICODIN)  5-325 MG per tablet   Oral   Take 1-2 tablets by mouth every 6 (six) hours as needed for pain.   14 tablet   0   . nitrofurantoin, macrocrystal-monohydrate, (MACROBID) 100 MG capsule   Oral   Take 1 capsule (100 mg total) by mouth 2 (two) times daily. X 7 days   20 capsule   0   . oxyCODONE-acetaminophen (PERCOCET/ROXICET) 5-325 MG per tablet   Oral   Take 1-2 tablets by mouth every 6 (six) hours as needed for pain.   20 tablet   0   . EXPIRED: promethazine (PHENERGAN) 25 MG tablet   Oral   Take 1 tablet (25 mg total) by mouth every  6 (six) hours as needed for nausea.   10 tablet   0   . promethazine (PHENERGAN) 25 MG tablet   Oral   Take 1 tablet (25 mg total) by mouth every 6 (six) hours as needed for nausea.   15 tablet   0   . promethazine (PHENERGAN) 25 MG tablet   Oral   Take 1 tablet (25 mg total) by mouth every 6 (six) hours as needed for nausea.   12 tablet   0     Triage Vitals: BP 122/80  Pulse 105  Temp(Src) 0 F (-17.8 C)  Resp 12  Ht 5\' 7"  (1.702 m)  Wt 208 lb (94.348 kg)  BMI 32.57 kg/m2  SpO2 100%  LMP 12/22/2012  Physical Exam  Nursing note and vitals reviewed. Constitutional: She is oriented to person, place, and time. She appears well-developed and well-nourished. No distress.  HENT:  Head: Normocephalic and atraumatic.  Mouth/Throat: Oropharynx is clear and moist.  Dry MM  Eyes: Conjunctivae and EOM are normal. Pupils are equal, round, and reactive to light.  Neck: Normal range of motion. Neck supple. No tracheal deviation present.  Cardiovascular: Normal rate and regular rhythm.   No murmur heard. Pulmonary/Chest: Effort normal and breath sounds normal. No respiratory distress. She has no wheezes. She has no rales.  Abdominal: Soft. There is no tenderness.  Musculoskeletal: Normal range of motion. She exhibits no edema (no pedal edema).  Neurological: She is alert and oriented to person, place, and time. No cranial nerve deficit.  Pt able to move both sets of fingers and toes  Skin: Skin is warm and dry.  Psychiatric: She has a normal mood and affect. Her behavior is normal.    ED Course  Procedures (including critical care time)  DIAGNOSTIC STUDIES: Oxygen Saturation is 100% on room air, normal by my interpretation.    COORDINATION OF CARE: 8:51 PM-Discussed treatment plan which includes CT of abdomen, CBC panel, UA and medications with pt at bedside and pt agreed to plan.   9:00 PM- Ordered 1,000 mL of bolus, 4 mg Zofran injection and 1 mg Dilaudid  injection  Labs Reviewed  URINALYSIS, ROUTINE W REFLEX MICROSCOPIC - Abnormal; Notable for the following:    Specific Gravity, Urine >1.030 (*)    All other components within normal limits  COMPREHENSIVE METABOLIC PANEL - Abnormal; Notable for the following:    Sodium 134 (*)    Potassium 3.1 (*)    Glucose, Bld 114 (*)    Total Bilirubin 0.1 (*)    All other components within normal limits  CBC WITH DIFFERENTIAL - Abnormal; Notable for the following:    RDW 16.6 (*)    Lymphs Abs 4.3 (*)    All other components within normal limits  PREGNANCY, URINE  LIPASE, BLOOD   Ct Abdomen Pelvis W Contrast  01/06/2013  *RADIOLOGY REPORT*  Clinical Data: Abdominal pain, emesis, diarrhea  CT ABDOMEN AND PELVIS WITH CONTRAST  Technique:  Multidetector CT imaging of the abdomen and pelvis was performed following the standard protocol during bolus administration of intravenous contrast.  Contrast:  100 ml Omnipaque 300  Comparison: CT abdomen 03/29/2012  Findings: Lung bases are clear.  No pericardial fluid.  No focal hepatic lesion.  Gallbladder is contracted.  The pancreas, spleen, adrenal glands, and kidneys are normal.  The stomach, small bowel, and cecum are normal.  The colon and rectosigmoid colon are normal. Appendix is normal.  Abdominal aorta is normal caliber.  No retroperitoneal or periportal lymphadenopathy.  No free fluid the pelvis.  The bladder, uterus, and ovaries are normal.  No free fluid the pelvis.  No pelvic lymphadenopathy. Review of  bone windows demonstrates no aggressive osseous lesions.  IMPRESSION: 1.  No acute abdominal or pelvic findings. 2.  Normal appendix.   Original Report Authenticated By: Genevive Bi, M.D.      1. Abdominal pain       MDM  Will workup for the right lower corner abdominal pain without any specific findings. Possibly could be a gastroenteritis-type picture with vomiting and diarrhea but not extensive amounts of vomiting or diarrhea. No evidence of  appendicitis no evidence of pelvic abnormalities. Will discharge home with Phenergan and pain medication. Patient will followup with her Dr. [place].    I personally performed the services described in this documentation, which was scribed in my presence. The recorded information has been reviewed and is accurate.       April Jakes, MD 01/07/13 [phone]

## 2013-01-06 NOTE — ED Notes (Signed)
MD at bedside. 

## 2013-01-07 MED ORDER — PROMETHAZINE HCL 25 MG PO TABS: 25.0000 mg | ORAL_TABLET | Freq: Four times a day (QID) | ORAL | Status: DC | PRN

## 2013-01-07 MED ORDER — ONDANSETRON HCL 4 MG/2ML IJ SOLN
4.0000 mg | Freq: Once | INTRAMUSCULAR | Status: AC
  Administered 2013-01-07: 4 mg via INTRAVENOUS
  Filled 2013-01-07: qty 2

## 2013-01-07 MED ORDER — HYDROMORPHONE HCL PF 1 MG/ML IJ SOLN
1.0000 mg | Freq: Once | INTRAMUSCULAR | Status: AC
  Administered 2013-01-07: 1 mg via INTRAVENOUS
  Filled 2013-01-07: qty 1

## 2013-01-07 MED ORDER — HYDROCODONE-ACETAMINOPHEN 5-325 MG PO TABS: 1.0000 | ORAL_TABLET | Freq: Four times a day (QID) | ORAL | Status: DC | PRN

## 2013-01-07 NOTE — ED Notes (Signed)
Discharge instructions given and reviewed with patient.  Prescriptions given for Vicodin and Phenergan; effects and use explained.  Patient verbalized understanding of sedating effects of both medications and to follow up with her MD in [place].  Patient ambulatory with steady gait; family to drive home.

## 2013-01-21 DIAGNOSIS — G8929 Other chronic pain: Secondary | ICD-10-CM | POA: Insufficient documentation

## 2013-01-21 DIAGNOSIS — M549 Dorsalgia, unspecified: Secondary | ICD-10-CM | POA: Insufficient documentation

## 2013-01-21 DIAGNOSIS — Z87442 Personal history of urinary calculi: Secondary | ICD-10-CM | POA: Insufficient documentation

## 2013-01-21 DIAGNOSIS — Y929 Unspecified place or not applicable: Secondary | ICD-10-CM | POA: Insufficient documentation

## 2013-01-21 DIAGNOSIS — Y9389 Activity, other specified: Secondary | ICD-10-CM | POA: Insufficient documentation

## 2013-01-21 DIAGNOSIS — W1809XA Striking against other object with subsequent fall, initial encounter: Secondary | ICD-10-CM | POA: Insufficient documentation

## 2013-01-21 DIAGNOSIS — Z8669 Personal history of other diseases of the nervous system and sense organs: Secondary | ICD-10-CM | POA: Insufficient documentation

## 2013-01-21 DIAGNOSIS — F17200 Nicotine dependence, unspecified, uncomplicated: Secondary | ICD-10-CM | POA: Insufficient documentation

## 2013-01-21 DIAGNOSIS — S5010XA Contusion of unspecified forearm, initial encounter: Secondary | ICD-10-CM | POA: Insufficient documentation

## 2013-01-21 DIAGNOSIS — Z8719 Personal history of other diseases of the digestive system: Secondary | ICD-10-CM | POA: Insufficient documentation

## 2013-01-22 ENCOUNTER — Encounter (HOSPITAL_COMMUNITY): Payer: Self-pay | Admitting: Emergency Medicine

## 2013-01-22 ENCOUNTER — Emergency Department (HOSPITAL_COMMUNITY): Payer: BC Managed Care – PPO

## 2013-01-22 ENCOUNTER — Emergency Department (HOSPITAL_COMMUNITY)
Admission: EM | Admit: 2013-01-22 | Discharge: 2013-01-22 | Disposition: A | Discharge status: Home / Self Care | Payer: BC Managed Care – PPO | Attending: Emergency Medicine | Admitting: Emergency Medicine

## 2013-01-22 MED ORDER — TRAMADOL HCL 50 MG PO TABS: 50.0000 mg | ORAL_TABLET | Freq: Four times a day (QID) | ORAL | Status: DC | PRN

## 2013-01-22 MED ORDER — NAPROXEN 500 MG PO TABS: 500.0000 mg | ORAL_TABLET | Freq: Two times a day (BID) | ORAL | Status: DC

## 2013-01-22 MED ORDER — OXYCODONE-ACETAMINOPHEN 5-325 MG PO TABS
2.0000 | ORAL_TABLET | Freq: Once | ORAL | Status: AC
  Administered 2013-01-22: 2 via ORAL
  Filled 2013-01-22: qty 2

## 2013-01-22 NOTE — ED Provider Notes (Signed)
History     CSN: [id number]  Arrival date & time 01/21/13  2352   First MD Initiated Contact with Patient 01/22/13 [phone]      Chief Complaint  Patient presents with  . Wrist Pain    (Consider location/radiation/quality/duration/timing/severity/associated sxs/prior treatment) HPI Comments: The patient presents with a complaint of right wrist pain which occurred just prior to arrival while she was carrying a dresser, had a mechanical fall to the ground and fell on her right wrist though she is unsure if the dresser fell on top of her or not. She denies any other injuries other than her right wrist. The pain is constant, severe, worse with palpation and associated with redness over the radial surface of the distal forearm, mild swelling but no numbness or weakness. She is not taking any medications nor has she used any ice prior to arrival.  Patient is a 32 y.o. female presenting with wrist pain. The history is provided by the patient and a relative.  Wrist Pain    Past Medical History  Diagnosis Date  . Acid reflux   . Chronic back pain   . Renal disorder     kidney stones  . Insomnia     Past Surgical History  Procedure Laterality Date  . Foot surgery    . Tubal ligation      No family history on file.  History  Substance Use Topics  . Smoking status: Current Every Day Smoker -- 1.00 packs/day  . Smokeless tobacco: Not on file  . Alcohol Use: No    OB History   Grav Para Term Preterm Abortions TAB SAB Ect Mult Living                  Review of Systems  HENT: Negative for neck pain.   Gastrointestinal: Negative for nausea and vomiting.  Musculoskeletal: Positive for joint swelling (Right distal forearm). Negative for back pain.  Neurological: Negative for weakness and numbness.    Allergies  Review of patient's allergies indicates no known allergies.  Home Medications   Current Outpatient Rx  Name  Route  Sig  Dispense  Refill  . acetaminophen (PAIN  RELIEVER) 325 MG tablet   Oral   Take 650-975 mg by mouth every 6 (six) hours as needed. For pain         . promethazine (PHENERGAN) 25 MG tablet   Oral   Take 1 tablet (25 mg total) by mouth every 6 (six) hours as needed for nausea.   12 tablet   0   . HYDROcodone-acetaminophen (NORCO/VICODIN) 5-325 MG per tablet   Oral   Take 1 tablet by mouth every 6 (six) hours as needed. For pain         . HYDROcodone-acetaminophen (NORCO/VICODIN) 5-325 MG per tablet   Oral   Take 1-2 tablets by mouth every 6 (six) hours as needed for pain.   14 tablet   0   . naproxen (NAPROSYN) 500 MG tablet   Oral   Take 1 tablet (500 mg total) by mouth 2 (two) times daily with a meal.   30 tablet   0   . nitrofurantoin, macrocrystal-monohydrate, (MACROBID) 100 MG capsule   Oral   Take 1 capsule (100 mg total) by mouth 2 (two) times daily. X 7 days   20 capsule   0   . oxyCODONE-acetaminophen (PERCOCET/ROXICET) 5-325 MG per tablet   Oral   Take 1-2 tablets by mouth every 6 (six) hours as  needed for pain.   20 tablet   0   . EXPIRED: promethazine (PHENERGAN) 25 MG tablet   Oral   Take 1 tablet (25 mg total) by mouth every 6 (six) hours as needed for nausea.   10 tablet   0   . promethazine (PHENERGAN) 25 MG tablet   Oral   Take 1 tablet (25 mg total) by mouth every 6 (six) hours as needed for nausea.   15 tablet   0   . traMADol (ULTRAM) 50 MG tablet   Oral   Take 1 tablet (50 mg total) by mouth every 6 (six) hours as needed for pain.   15 tablet   0     BP 139/110  Pulse 103  Temp(Src) 97.9 F (36.6 C) (Oral)  Resp 20  Ht 5\' 7"  (1.702 m)  Wt 211 lb (95.709 kg)  BMI 33.04 kg/m2  SpO2 100%  LMP 12/22/2012  Physical Exam  Nursing note and vitals reviewed. Constitutional: She appears well-developed and well-nourished. No distress.  HENT:  Head: Normocephalic and atraumatic.  Eyes: Conjunctivae are normal. No scleral icterus.  Cardiovascular: Normal rate, regular  rhythm and intact distal pulses.   Pulmonary/Chest: Effort normal and breath sounds normal.  Musculoskeletal: She exhibits tenderness ( Tender palpation over the right distal forearm, there is associated erythema and mild swelling). She exhibits no edema.  No pain with range of motion of the fingers in extension or flexion, no pain in the anatomic snuff box of the right hand  Neurological: She is alert.  Skin: Skin is warm and dry. No rash noted. She is not diaphoretic. There is erythema ( Erythema over the distal radial forearm).    ED Course  Procedures (including critical care time)  Labs Reviewed - No data to display Dg Wrist Complete Right  01/22/2013  *RADIOLOGY REPORT*  Clinical Data: Lateral right wrist pain, redness, and swelling after fall.  RIGHT WRIST - COMPLETE 3+ VIEW  Comparison: Right wrist 10/09/2004.  Right forearm 06/02/2012.  Findings: Right wrist appears intact. No evidence of acute fracture or subluxation.  No focal bone lesions.  Bone matrix and cortex appear intact.  No abnormal radiopaque densities in the soft tissues.  No significant change since previous study.  IMPRESSION: No acute bony abnormalities.   Original Report Authenticated By: Burman Nieves, M.D.      1. Contusion of right forearm, initial encounter       MDM  No open wounds, suspect possible fracture versus contusion, ice, elevate, immobilize, image.  xrays are normal, treated with ice, elevation, pain meds, stable for d/c.      Vida Roller, MD 01/22/13 [phone]

## 2013-01-22 NOTE — ED Notes (Signed)
Pain to right wrist after falling this AM moving furniture, no other complains, mild swelling noted, normal ROM, no obvious deformtity noted.

## 2013-01-22 NOTE — ED Notes (Signed)
Patient states was moving some dressers and fell and hit right wrist on dresser.  Bruising noted to right wrist.

## 2013-02-17 ENCOUNTER — Emergency Department: Payer: Self-pay | Admitting: Internal Medicine

## 2013-02-23 ENCOUNTER — Encounter (HOSPITAL_COMMUNITY): Payer: Self-pay

## 2013-02-23 ENCOUNTER — Emergency Department (HOSPITAL_COMMUNITY): Payer: BC Managed Care – PPO

## 2013-02-23 ENCOUNTER — Emergency Department (HOSPITAL_COMMUNITY)
Admission: EM | Admit: 2013-02-23 | Discharge: 2013-02-23 | Disposition: A | Discharge status: Home / Self Care | Payer: BC Managed Care – PPO | Attending: Emergency Medicine | Admitting: Emergency Medicine

## 2013-02-23 DIAGNOSIS — IMO0002 Reserved for concepts with insufficient information to code with codable children: Secondary | ICD-10-CM | POA: Insufficient documentation

## 2013-02-23 DIAGNOSIS — Z87442 Personal history of urinary calculi: Secondary | ICD-10-CM | POA: Insufficient documentation

## 2013-02-23 DIAGNOSIS — Y939 Activity, unspecified: Secondary | ICD-10-CM | POA: Insufficient documentation

## 2013-02-23 DIAGNOSIS — M549 Dorsalgia, unspecified: Secondary | ICD-10-CM | POA: Insufficient documentation

## 2013-02-23 DIAGNOSIS — Z8669 Personal history of other diseases of the nervous system and sense organs: Secondary | ICD-10-CM | POA: Insufficient documentation

## 2013-02-23 DIAGNOSIS — Z9889 Other specified postprocedural states: Secondary | ICD-10-CM | POA: Insufficient documentation

## 2013-02-23 DIAGNOSIS — S9030XA Contusion of unspecified foot, initial encounter: Secondary | ICD-10-CM | POA: Insufficient documentation

## 2013-02-23 DIAGNOSIS — G8929 Other chronic pain: Secondary | ICD-10-CM | POA: Insufficient documentation

## 2013-02-23 DIAGNOSIS — Y929 Unspecified place or not applicable: Secondary | ICD-10-CM | POA: Insufficient documentation

## 2013-02-23 DIAGNOSIS — F17200 Nicotine dependence, unspecified, uncomplicated: Secondary | ICD-10-CM | POA: Insufficient documentation

## 2013-02-23 DIAGNOSIS — Z8719 Personal history of other diseases of the digestive system: Secondary | ICD-10-CM | POA: Insufficient documentation

## 2013-02-23 DIAGNOSIS — S9031XA Contusion of right foot, initial encounter: Secondary | ICD-10-CM

## 2013-02-23 MED ORDER — TRAMADOL HCL 50 MG PO TABS
50.0000 mg | ORAL_TABLET | Freq: Four times a day (QID) | ORAL | Status: DC | PRN
Start: 2013-02-23 — End: 2013-06-04

## 2013-02-23 MED ORDER — NAPROXEN 500 MG PO TABS: 500.0000 mg | ORAL_TABLET | Freq: Two times a day (BID) | ORAL | Status: DC

## 2013-02-23 NOTE — ED Notes (Signed)
Right foot pain for 2 weeks, has appointment w/ specialist may 15th.  Dr.kerner in [place]

## 2013-02-23 NOTE — ED Provider Notes (Signed)
History     CSN: [id number]  Arrival date & time 02/23/13  1601   First MD Initiated Contact with Patient 02/23/13 1611      Chief Complaint  Patient presents with  . Foot Pain    (Consider location/radiation/quality/duration/timing/severity/associated sxs/prior treatment) HPI Comments: Patient with hx of foot surgery three years ago , c/o increased pain to her foot after a direct blow to her foot two weeks ago.  States she has an appt with her podiatrist in [place] on May 15.  She states pain is worse with weight bearing and improves with rest.  She denies redness, swelling , numbness or weakness of the foot or ankle.    Patient is a 32 y.o. female presenting with lower extremity pain. The history is provided by the patient.  Foot Pain This is a chronic problem. The current episode started 1 to 4 weeks ago. The problem occurs constantly. The problem has been unchanged. Associated symptoms include arthralgias. Pertinent negatives include no chills, fever, joint swelling, neck pain, numbness, rash, vomiting or weakness. The symptoms are aggravated by standing, twisting and walking. She has tried NSAIDs for the symptoms. The treatment provided no relief.    Past Medical History  Diagnosis Date  . Acid reflux   . Chronic back pain   . Renal disorder     kidney stones  . Insomnia     Past Surgical History  Procedure Laterality Date  . Foot surgery    . Tubal ligation      No family history on file.  History  Substance Use Topics  . Smoking status: Current Every Day Smoker -- 1.00 packs/day    Types: Cigarettes  . Smokeless tobacco: Not on file  . Alcohol Use: No    OB History   Grav Para Term Preterm Abortions TAB SAB Ect Mult Living                  Review of Systems  Constitutional: Negative for fever and chills.  HENT: Negative for neck pain.   Gastrointestinal: Negative for vomiting.  Genitourinary: Negative for dysuria and difficulty urinating.    Musculoskeletal: Positive for arthralgias. Negative for back pain and joint swelling.  Skin: Negative for color change, rash and wound.  Neurological: Negative for weakness and numbness.  All other systems reviewed and are negative.    Allergies  Review of patient's allergies indicates no known allergies.  Home Medications   Current Outpatient Rx  Name  Route  Sig  Dispense  Refill  . Aspirin-Acetaminophen-Caffeine (GOODY HEADACHE PO)   Oral   Take 1 Package by mouth 2 (two) times daily as needed (for pain).         . promethazine (PHENERGAN) 25 MG tablet   Oral   Take 1 tablet (25 mg total) by mouth every 6 (six) hours as needed for nausea.   12 tablet   0     BP 116/76  Pulse 95  Temp(Src) 97.3 F (36.3 C) (Oral)  Resp 18  Ht 5\' 7"  (1.702 m)  Wt 200 lb (90.719 kg)  BMI 31.32 kg/m2  SpO2 100%  LMP 02/23/2013  Physical Exam  Nursing note and vitals reviewed. Constitutional: She is oriented to person, place, and time. She appears well-developed and well-nourished. No distress.  HENT:  Head: Normocephalic and atraumatic.  Cardiovascular: Normal rate, regular rhythm, normal heart sounds and intact distal pulses.   Pulmonary/Chest: Effort normal and breath sounds normal.  Musculoskeletal: She exhibits tenderness.  She exhibits no edema.       Right foot: She exhibits tenderness and bony tenderness. She exhibits normal range of motion, no swelling, normal capillary refill, no crepitus, no deformity and no laceration.       Feet:  Localized ttp of the dorsal right foot.  Scant bruising, old appearing.  ROM is preserved.  DP pulse is brisk, distal sensation intact.  No erythema, abrasion, or bony deformity.  No proximal tenderness  Neurological: She is alert and oriented to person, place, and time. She exhibits normal muscle tone. Coordination normal.  Skin: Skin is warm and dry.    ED Course  Procedures (including critical care time)  Labs Reviewed - No data to  display Dg Foot Complete Right  02/23/2013  *RADIOLOGY REPORT*  Clinical Data: Right foot pain, prior surgery  RIGHT FOOT COMPLETE - 3+ VIEW  Comparison: 01/28/2011  Findings: No fracture or dislocation is seen.  Postsurgical changes involving the medial midfoot.  On the lateral view, a single screw has partially backed out.  Degenerative changes of the dorsal midfoot.  The visualized soft tissues are unremarkable.  IMPRESSION: No fracture or dislocation is seen.  Postsurgical changes involving the midfoot.  A single screw has partially backed out, best demonstrated on the lateral view.   Original Report Authenticated By: Charline Bills, M.D.     Post-op shoe applied, pain improved, remains NV intact    MDM    Patient advised of the x-ray findings.  She has an appt with her podiatrist on May 15.  No erythema, edema of the foot.  Old appear bruis is present to dorsal foot.  No sensory or neuro deficits.  No proximal tenderness.    The patient appears reasonably screened and/or stabilized for discharge and I doubt any other medical condition or other [facility] requiring further screening, evaluation, or treatment in the ED at this time prior to discharge.     Aanvi Voyles L. Trisha Mangle, PA-C 02/24/13 2129

## 2013-02-27 NOTE — ED Provider Notes (Signed)
Medical screening examination/treatment/procedure(s) were performed by non-physician practitioner and as supervising physician I was immediately available for consultation/collaboration. Lula Michaux, MD, FACEP   Avry Roedl L Kali Deadwyler, MD 02/27/13 1050 

## 2013-03-25 ENCOUNTER — Emergency Department (HOSPITAL_COMMUNITY)
Admission: EM | Admit: 2013-03-25 | Discharge: 2013-03-25 | Disposition: A | Discharge status: Home / Self Care | Payer: BC Managed Care – PPO | Attending: Emergency Medicine | Admitting: Emergency Medicine

## 2013-03-25 ENCOUNTER — Encounter (HOSPITAL_COMMUNITY): Payer: Self-pay | Admitting: Emergency Medicine

## 2013-03-25 DIAGNOSIS — G8929 Other chronic pain: Secondary | ICD-10-CM | POA: Insufficient documentation

## 2013-03-25 DIAGNOSIS — Z8719 Personal history of other diseases of the digestive system: Secondary | ICD-10-CM | POA: Insufficient documentation

## 2013-03-25 DIAGNOSIS — M5430 Sciatica, unspecified side: Secondary | ICD-10-CM | POA: Insufficient documentation

## 2013-03-25 DIAGNOSIS — Z87442 Personal history of urinary calculi: Secondary | ICD-10-CM | POA: Insufficient documentation

## 2013-03-25 DIAGNOSIS — M5431 Sciatica, right side: Secondary | ICD-10-CM

## 2013-03-25 DIAGNOSIS — M79609 Pain in unspecified limb: Secondary | ICD-10-CM | POA: Insufficient documentation

## 2013-03-25 DIAGNOSIS — F17200 Nicotine dependence, unspecified, uncomplicated: Secondary | ICD-10-CM | POA: Insufficient documentation

## 2013-03-25 MED ORDER — OXYCODONE-ACETAMINOPHEN 5-325 MG PO TABS
1.0000 | ORAL_TABLET | Freq: Once | ORAL | Status: AC
  Administered 2013-03-25: 1 via ORAL
  Filled 2013-03-25: qty 1

## 2013-03-25 MED ORDER — CYCLOBENZAPRINE HCL 10 MG PO TABS: 10.0000 mg | ORAL_TABLET | Freq: Two times a day (BID) | ORAL | Status: DC | PRN

## 2013-03-25 MED ORDER — CYCLOBENZAPRINE HCL 10 MG PO TABS
10.0000 mg | ORAL_TABLET | Freq: Once | ORAL | Status: AC
  Administered 2013-03-25: 10 mg via ORAL
  Filled 2013-03-25: qty 1

## 2013-03-25 NOTE — ED Notes (Signed)
Patient complaining of lower back pain radiating into right leg starting yesterday. Denies injury.

## 2013-03-25 NOTE — ED Provider Notes (Signed)
Medical screening examination/treatment/procedure(s) were performed by non-physician practitioner and as supervising physician I was immediately available for consultation/collaboration.  Ashlin Hidalgo S. Shyler Hamill, MD 03/25/13 0652 

## 2013-03-25 NOTE — ED Provider Notes (Signed)
History     CSN: [id number]  Arrival date & time 03/25/13  0011   First MD Initiated Contact with Patient 03/25/13 0032      Chief Complaint  Patient presents with  . Back Pain    (Consider location/radiation/quality/duration/timing/severity/associated sxs/prior treatment) HPI April Boyer is a 32 y.o. female who presents to the ED with lower back, right leg and foot pain. She was evaluated here in April for pain in her right foot after an injury. She had x-rays that showed the pins she has in her foot may be coming through the skin. She followed up with her doctor in [place] on 03/21/13 and he gave her Tramadol and told her to return on May 22 nd and they would let her know then if they will be doing surgery. The Tramadol is not helping her pain and now her back is hurting as well. The history was provided by the patient and her medical records.  Past Medical History  Diagnosis Date  . Acid reflux   . Chronic back pain   . Renal disorder     kidney stones  . Insomnia     Past Surgical History  Procedure Laterality Date  . Foot surgery    . Tubal ligation      History reviewed. No pertinent family history.  History  Substance Use Topics  . Smoking status: Current Every Day Smoker -- 1.00 packs/day    Types: Cigarettes  . Smokeless tobacco: Not on file  . Alcohol Use: No    OB History   Grav Para Term Preterm Abortions TAB SAB Ect Mult Living                  Review of Systems  Constitutional: Negative for fever and chills.  HENT: Negative for neck pain.   Respiratory: Negative for cough.   Gastrointestinal: Negative for nausea and vomiting.  Musculoskeletal: Positive for back pain.       Right leg and foot pain  Skin: Negative for rash.    Allergies  Review of patient's allergies indicates no known allergies.  Home Medications   Current Outpatient Rx  Name  Route  Sig  Dispense  Refill  . Aspirin-Acetaminophen-Caffeine (GOODY HEADACHE PO)  Oral   Take 1 Package by mouth 2 (two) times daily as needed (for pain).         . naproxen (NAPROSYN) 500 MG tablet   Oral   Take 1 tablet (500 mg total) by mouth 2 (two) times daily with a meal.   20 tablet   0   . promethazine (PHENERGAN) 25 MG tablet   Oral   Take 1 tablet (25 mg total) by mouth every 6 (six) hours as needed for nausea.   12 tablet   0   . traMADol (ULTRAM) 50 MG tablet   Oral   Take 1 tablet (50 mg total) by mouth every 6 (six) hours as needed for pain.   20 tablet   0     BP 121/89  Pulse 88  Temp(Src) 97.4 F (36.3 C) (Oral)  Resp 16  Ht 5\' 7"  (1.702 m)  Wt 200 lb (90.719 kg)  BMI 31.32 kg/m2  SpO2 99%  LMP 02/23/2013  Physical Exam  Nursing note and vitals reviewed. Constitutional: She is oriented to person, place, and time. She appears well-developed and well-nourished. No distress.  HENT:  Head: Normocephalic.  Eyes: EOM are normal.  Neck: Neck supple.  Cardiovascular: Normal rate.  Pulmonary/Chest: Effort normal.  Musculoskeletal:       Lumbar back: She exhibits decreased range of motion, tenderness and spasm.       Back:  Neurological: She is alert and oriented to person, place, and time. She has normal strength and normal reflexes. No cranial nerve deficit or sensory deficit.  Pedal pulses equal bilateral. Adequate circulation. Good touch sensation. Pain with palpation over sciatic nerve right.  Skin: Skin is warm and dry.  Psychiatric: She has a normal mood and affect.    ED Course  Procedures (including critical care time)  MDM  32 y.o. female with right sciatic nerve pain. Discussed with the patient clinical findings and all questioned fully answered. She will follow up with her doctor as scheduled.    Medication List    TAKE these medications       cyclobenzaprine 10 MG tablet  Commonly known as:  FLEXERIL  Take 1 tablet (10 mg total) by mouth 2 (two) times daily as needed for muscle spasms.      ASK your doctor  about these medications       GOODY HEADACHE PO  Take 1 Package by mouth 2 (two) times daily as needed (for pain).     naproxen 500 MG tablet  Commonly known as:  NAPROSYN  Take 1 tablet (500 mg total) by mouth 2 (two) times daily with a meal.     promethazine 25 MG tablet  Commonly known as:  PHENERGAN  Take 1 tablet (25 mg total) by mouth every 6 (six) hours as needed for nausea.     traMADol 50 MG tablet  Commonly known as:  ULTRAM  Take 1 tablet (50 mg total) by mouth every 6 (six) hours as needed for pain.             [place], [place] 03/25/13 [phone]

## 2013-06-04 ENCOUNTER — Emergency Department (HOSPITAL_COMMUNITY)
Admission: EM | Admit: 2013-06-04 | Discharge: 2013-06-04 | Disposition: A | Discharge status: Home / Self Care | Payer: BC Managed Care – PPO | Attending: Emergency Medicine | Admitting: Emergency Medicine

## 2013-06-04 ENCOUNTER — Encounter (HOSPITAL_COMMUNITY): Payer: Self-pay | Admitting: *Deleted

## 2013-06-04 DIAGNOSIS — F17200 Nicotine dependence, unspecified, uncomplicated: Secondary | ICD-10-CM | POA: Insufficient documentation

## 2013-06-04 DIAGNOSIS — M5431 Sciatica, right side: Secondary | ICD-10-CM

## 2013-06-04 DIAGNOSIS — Z87448 Personal history of other diseases of urinary system: Secondary | ICD-10-CM | POA: Insufficient documentation

## 2013-06-04 DIAGNOSIS — M79609 Pain in unspecified limb: Secondary | ICD-10-CM | POA: Insufficient documentation

## 2013-06-04 DIAGNOSIS — G8929 Other chronic pain: Secondary | ICD-10-CM | POA: Insufficient documentation

## 2013-06-04 DIAGNOSIS — Z3202 Encounter for pregnancy test, result negative: Secondary | ICD-10-CM | POA: Insufficient documentation

## 2013-06-04 DIAGNOSIS — Z8719 Personal history of other diseases of the digestive system: Secondary | ICD-10-CM | POA: Insufficient documentation

## 2013-06-04 DIAGNOSIS — M5430 Sciatica, unspecified side: Secondary | ICD-10-CM | POA: Insufficient documentation

## 2013-06-04 LAB — URINALYSIS, ROUTINE W REFLEX MICROSCOPIC
Bilirubin Urine: NEGATIVE
Glucose, UA: NEGATIVE mg/dL
Nitrite: NEGATIVE
Specific Gravity, Urine: 1.015 (ref 1.005–1.030)
pH: 6 (ref 5.0–8.0)

## 2013-06-04 LAB — URINE MICROSCOPIC-ADD ON

## 2013-06-04 MED ORDER — HYDROCODONE-ACETAMINOPHEN 5-325 MG PO TABS: ORAL_TABLET | ORAL | Status: DC

## 2013-06-04 MED ORDER — PREDNISONE 10 MG PO TABS: ORAL_TABLET | ORAL | Status: DC

## 2013-06-04 MED ORDER — CYCLOBENZAPRINE HCL 10 MG PO TABS: 10.0000 mg | ORAL_TABLET | Freq: Three times a day (TID) | ORAL | Status: DC | PRN

## 2013-06-04 NOTE — ED Notes (Signed)
Lower back pain x 3 days.  Denies injury.  

## 2013-06-04 NOTE — ED Provider Notes (Signed)
CSN: [id number]     Arrival date & time 06/04/13  1125 History     First MD Initiated Contact with Patient 06/04/13 1203     Chief Complaint  Patient presents with  . Back Pain   (Consider location/radiation/quality/duration/timing/severity/associated sxs/prior Treatment) Patient is a 32 y.o. female presenting with back pain. The history is provided by the patient.  Back Pain Location:  Lumbar spine and sacro-iliac joint Quality:  Aching Radiates to:  R posterior upper leg Pain severity:  Moderate Pain is:  Same all the time Onset quality:  Gradual Duration:  3 days Timing:  Constant Progression:  Worsening Chronicity:  New Context: lifting heavy objects   Context: not falling, not jumping from heights, not recent illness, not recent injury and not twisting   Relieved by:  Bed rest Worsened by:  Ambulation, bending, twisting and movement Ineffective treatments:  NSAIDs and OTC medications Associated symptoms: leg pain   Associated symptoms: no abdominal pain, no abdominal swelling, no bladder incontinence, no bowel incontinence, no chest pain, no dysuria, no fever, no headaches, no numbness, no paresthesias, no pelvic pain, no perianal numbness, no tingling, no weakness and no weight loss     Past Medical History  Diagnosis Date  . Acid reflux   . Chronic back pain   . Renal disorder     kidney stones  . Insomnia    Past Surgical History  Procedure Laterality Date  . Foot surgery    . Tubal ligation     No family history on file. History  Substance Use Topics  . Smoking status: Current Every Day Smoker -- 1.00 packs/day    Types: Cigarettes  . Smokeless tobacco: Not on file  . Alcohol Use: No   OB History   Grav Para Term Preterm Abortions TAB SAB Ect Mult Living                 Review of Systems  Constitutional: Negative for fever, weight loss, activity change and appetite change.  Respiratory: Negative for shortness of breath.   Cardiovascular: Negative  for chest pain.  Gastrointestinal: Negative for nausea, vomiting, abdominal pain, constipation and bowel incontinence.  Genitourinary: Negative for bladder incontinence, dysuria, hematuria, flank pain, decreased urine volume, difficulty urinating and pelvic pain.       No perineal numbness or incontinence of urine or feces  Musculoskeletal: Positive for back pain. Negative for joint swelling.  Skin: Negative for rash.  Neurological: Negative for tingling, weakness, numbness, headaches and paresthesias.  All other systems reviewed and are negative.    Allergies  Review of patient's allergies indicates no known allergies.  Home Medications   Current Outpatient Rx  Name  Route  Sig  Dispense  Refill  . Aspirin-Acetaminophen-Caffeine (GOODY HEADACHE PO)   Oral   Take 1 Package by mouth 2 (two) times daily as needed (for pain).          BP 118/81  Pulse 100  Temp(Src) 97.9 F (36.6 C) (Oral)  Resp 16  Ht 5\' 6"  (1.676 m)  Wt 208 lb (94.348 kg)  BMI 33.59 kg/m2  SpO2 100%  LMP 05/28/2013 Physical Exam  Nursing note and vitals reviewed. Constitutional: She is oriented to person, place, and time. She appears well-developed and well-nourished. No distress.  HENT:  Head: Normocephalic and atraumatic.  Neck: Normal range of motion. Neck supple.  Cardiovascular: Normal rate, regular rhythm, normal heart sounds and intact distal pulses.   No murmur heard. Pulmonary/Chest: Effort normal and  breath sounds normal. No respiratory distress.  Musculoskeletal: She exhibits tenderness. She exhibits no edema.       Lumbar back: She exhibits tenderness and pain. She exhibits normal range of motion, no swelling, no deformity, no laceration and normal pulse.       Back:  ttp of the right lumbar paraspinal muscles and SI joint .  No spinal tenderness.  DP pulses are brisk and symmetrical.  Distal sensation intact.  Hip Flexors/Extensors are intact  Neurological: She is alert and oriented to  person, place, and time. No cranial nerve deficit or sensory deficit. She exhibits normal muscle tone. Coordination and gait normal.  Reflex Scores:      Patellar reflexes are 2+ on the right side and 2+ on the left side.      Achilles reflexes are 2+ on the right side and 2+ on the left side. Skin: Skin is warm and dry.    ED Course   Procedures (including critical care time)  Labs Reviewed  URINALYSIS, ROUTINE W REFLEX MICROSCOPIC - Abnormal; Notable for the following:    Hgb urine dipstick TRACE (*)    Leukocytes, UA SMALL (*)    All other components within normal limits  URINE MICROSCOPIC-ADD ON - Abnormal; Notable for the following:    Squamous Epithelial / LPF FEW (*)    All other components within normal limits  PREGNANCY, URINE    Urine culture pending MDM  Previous ED charts reviewed by me.    Patient has ttp of the right lumbar paraspinal muscles and SI joint.  No focal neuro deficits on exam.  Ambulates with a steady gait.   Seen here in May with same.  Doubt emergent neurological or infectious process.  Pt states she has appt with her PMD on 06/11/13 in [place]  Patient reviewed on the [place] and [place] narcotics databases.  No recent narcotics filled.  Fate Caster L. Trisha Mangle, PA-C 06/05/13 2155

## 2013-06-05 LAB — URINE CULTURE: Culture: NO GROWTH

## 2013-06-09 NOTE — ED Provider Notes (Signed)
Medical screening examination/treatment/procedure(s) were performed by non-physician practitioner and as supervising physician I was immediately available for consultation/collaboration.   Ashby Dawes, MD 06/09/13 [phone]

## 2014-02-09 ENCOUNTER — Emergency Department (HOSPITAL_COMMUNITY)
Admission: EM | Admit: 2014-02-09 | Discharge: 2014-02-09 | Disposition: A | Discharge status: Home / Self Care | Payer: BC Managed Care – PPO | Attending: Emergency Medicine | Admitting: Emergency Medicine

## 2014-02-09 ENCOUNTER — Encounter (HOSPITAL_COMMUNITY): Payer: Self-pay | Admitting: Emergency Medicine

## 2014-02-09 DIAGNOSIS — G8929 Other chronic pain: Secondary | ICD-10-CM | POA: Insufficient documentation

## 2014-02-09 DIAGNOSIS — M5416 Radiculopathy, lumbar region: Secondary | ICD-10-CM

## 2014-02-09 DIAGNOSIS — Z87442 Personal history of urinary calculi: Secondary | ICD-10-CM | POA: Insufficient documentation

## 2014-02-09 DIAGNOSIS — Z8719 Personal history of other diseases of the digestive system: Secondary | ICD-10-CM | POA: Insufficient documentation

## 2014-02-09 DIAGNOSIS — IMO0002 Reserved for concepts with insufficient information to code with codable children: Secondary | ICD-10-CM | POA: Insufficient documentation

## 2014-02-09 DIAGNOSIS — F17200 Nicotine dependence, unspecified, uncomplicated: Secondary | ICD-10-CM | POA: Insufficient documentation

## 2014-02-09 MED ORDER — CYCLOBENZAPRINE HCL 10 MG PO TABS: 10.0000 mg | ORAL_TABLET | Freq: Three times a day (TID) | ORAL | Status: DC | PRN

## 2014-02-09 MED ORDER — OXYCODONE-ACETAMINOPHEN 5-325 MG PO TABS
1.0000 | ORAL_TABLET | Freq: Once | ORAL | Status: AC
  Administered 2014-02-09: 1 via ORAL
  Filled 2014-02-09: qty 1

## 2014-02-09 MED ORDER — CYCLOBENZAPRINE HCL 10 MG PO TABS
10.0000 mg | ORAL_TABLET | Freq: Once | ORAL | Status: AC
  Administered 2014-02-09: 10 mg via ORAL
  Filled 2014-02-09: qty 1

## 2014-02-09 MED ORDER — HYDROCODONE-ACETAMINOPHEN 5-325 MG PO TABS
ORAL_TABLET | ORAL | Status: DC
Start: 2014-02-09 — End: 2014-07-15

## 2014-02-09 MED ORDER — PREDNISONE 10 MG PO TABS: ORAL_TABLET | ORAL | Status: DC

## 2014-02-09 NOTE — ED Notes (Signed)
Pt reports 1 week ago picked up a bag of potting soil  And has had pain in r lower back radiating down r leg since then.

## 2014-02-09 NOTE — ED Provider Notes (Signed)
Medical screening examination/treatment/procedure(s) were performed by non-physician practitioner and as supervising physician I was immediately available for consultation/collaboration.   EKG Interpretation None        Siani Utke W Hessie Varone, MD 02/09/14 1951 

## 2014-02-09 NOTE — ED Notes (Signed)
Tammy PA at bedside prior to RN, see PA assessment for further ,

## 2014-02-09 NOTE — Discharge Instructions (Signed)

## 2014-02-09 NOTE — ED Provider Notes (Signed)
CSN: [id number]     Arrival date & time 02/09/14  1726 History  This chart was scribed for non-physician practitioner, Pauline Aus, PA-C,working with Joya Gaskins, MD, by Karle Plumber, ED Scribe.  This patient was seen in room APFT21/APFT21 and the patient's care was started at 5:35 PM.  Chief Complaint  Patient presents with  . Back Pain   The history is provided by the patient. No language interpreter was used.   HPI Comments:  April Boyer is a 33 y.o. obese female with h/o chronic back pain secondary to right-sided sciatica and ruptured disc, who presents to the Emergency Department complaining of sudden onset, moderate right-sided lower back pain that radiates down her right leg that began approximately one week ago after lifting a 20 pound bag of potting soil. Pt states the pain runs laterally down her right leg down to her lower leg. She states the pain is worse at night. She states putting pressure on her right side, such as lying down, makes the pain worse. She denies urinary issues, abdominal pain, bowel or bladder incontinence or retention, weakness or numbness of the lower extremities. Pt was ambulatory to room without issue. She reports she is a smoker. She states pain is similar to previous sciatica.   She denies being treated anywhere for her chronic back issues.   Past Medical History  Diagnosis Date  . Acid reflux   . Chronic back pain   . Renal disorder     kidney stones  . Insomnia    Past Surgical History  Procedure Laterality Date  . Foot surgery    . Tubal ligation     No family history on file. History  Substance Use Topics  . Smoking status: Current Every Day Smoker -- 1.00 packs/day    Types: Cigarettes  . Smokeless tobacco: Not on file  . Alcohol Use: No   OB History   Grav Para Term Preterm Abortions TAB SAB Ect Mult Living                 Review of Systems  Constitutional: Negative for fever, chills and fatigue.  HENT: Negative for  sore throat and trouble swallowing.   Respiratory: Negative for cough, shortness of breath and wheezing.   Cardiovascular: Negative for chest pain and palpitations.  Gastrointestinal: Negative for nausea, vomiting, abdominal pain and blood in stool.  Genitourinary: Negative for dysuria, hematuria, flank pain and difficulty urinating.  Musculoskeletal: Positive for back pain. Negative for arthralgias, myalgias, neck pain and neck stiffness.  Skin: Negative for rash.  Neurological: Negative for dizziness, weakness and numbness.  Hematological: Does not bruise/bleed easily.    Allergies  Review of patient's allergies indicates no known allergies.  Home Medications   Current Outpatient Rx  Name  Route  Sig  Dispense  Refill  . Aspirin-Acetaminophen-Caffeine (GOODY HEADACHE PO)   Oral   Take 1 Package by mouth 2 (two) times daily as needed (for pain).         . cyclobenzaprine (FLEXERIL) 10 MG tablet   Oral   Take 1 tablet (10 mg total) by mouth 3 (three) times daily as needed.   21 tablet   0   . HYDROcodone-acetaminophen (NORCO/VICODIN) 5-325 MG per tablet      Take one-two tabs po q 4-6 hrs prn pain   12 tablet   0   . predniSONE (DELTASONE) 10 MG tablet      Take 6 tablets day one, 5 tablets day  two, 4 tablets day three, 3 tablets day four, 2 tablets day five, then 1 tablet day six   21 tablet   0    Triage Vitals: BP 133/59  Pulse 117  Temp(Src) 97.9 F (36.6 C) (Oral)  Resp 20  Ht 5\' 7"  (1.702 m)  Wt 200 lb (90.719 kg)  BMI 31.32 kg/m2  SpO2 99%  LMP 01/27/2014 Physical Exam  Nursing note and vitals reviewed. Constitutional: She is oriented to person, place, and time. She appears well-developed and well-nourished. No distress.  HENT:  Head: Normocephalic and atraumatic.  Neck: Normal range of motion. Neck supple.  Cardiovascular: Normal rate, regular rhythm, normal heart sounds and intact distal pulses.   No murmur heard. Pulmonary/Chest: Effort normal  and breath sounds normal. No respiratory distress.  Abdominal: Soft. She exhibits no distension. There is no tenderness.  Musculoskeletal: She exhibits tenderness. She exhibits no edema.       Lumbar back: She exhibits tenderness and pain. She exhibits normal range of motion, no swelling, no deformity, no laceration and normal pulse.  ttp of the right lumbar paraspinal muscles.  No spinal tenderness.  DP pulses are brisk and symmetrical.  Distal sensation intact.  Hip Flexors/Extensors are intact  Neurological: She is alert and oriented to person, place, and time. She has normal strength. No sensory deficit. She exhibits normal muscle tone. Coordination and gait normal.  Reflex Scores:      Patellar reflexes are 2+ on the right side and 2+ on the left side.      Achilles reflexes are 2+ on the right side and 2+ on the left side. Skin: Skin is warm and dry. No rash noted.    ED Course  Procedures (including critical care time) DIAGNOSTIC STUDIES: Oxygen Saturation is 99% on RA, normal by my interpretation.   COORDINATION OF CARE: 5:47 PM- Will prescribe steroids, muscle relaxer, and pain medication. Advised pt to alternate between ice compresses and heating pad. Will refer to PMD and pt requests info for pain management  Pt verbalizes understanding and agrees to plan.    Labs Review Labs Reviewed - No data to display Imaging Review No results found.   EKG Interpretation None      MDM   Final diagnoses:  Lumbar radicular pain   Patient has ttp of the right lumbar paraspinal muscles.  No focal neuro deficits on exam.  Ambulated in the dept with a steady gait.  No concerning sx's for emergent neurological or infectious process.   Pt reviewed on the [place] narcotics database w/o recent prescriptions   I personally performed the services described in this documentation, which was scribed in my presence. The recorded information has been reviewed and is accurate.    Sartaj Hoskin L.  Trisha Mangleriplett, PA-C 02/09/14 1850

## 2014-03-15 ENCOUNTER — Encounter (HOSPITAL_COMMUNITY): Payer: Self-pay | Admitting: Emergency Medicine

## 2014-03-15 ENCOUNTER — Emergency Department (HOSPITAL_COMMUNITY)
Admission: EM | Admit: 2014-03-15 | Discharge: 2014-03-15 | Disposition: A | Discharge status: Home / Self Care | Payer: BC Managed Care – PPO | Attending: Emergency Medicine | Admitting: Emergency Medicine

## 2014-03-15 DIAGNOSIS — L03119 Cellulitis of unspecified part of limb: Principal | ICD-10-CM

## 2014-03-15 DIAGNOSIS — Z79899 Other long term (current) drug therapy: Secondary | ICD-10-CM | POA: Insufficient documentation

## 2014-03-15 DIAGNOSIS — L02419 Cutaneous abscess of limb, unspecified: Secondary | ICD-10-CM | POA: Insufficient documentation

## 2014-03-15 DIAGNOSIS — Z7982 Long term (current) use of aspirin: Secondary | ICD-10-CM | POA: Insufficient documentation

## 2014-03-15 DIAGNOSIS — F17200 Nicotine dependence, unspecified, uncomplicated: Secondary | ICD-10-CM | POA: Insufficient documentation

## 2014-03-15 DIAGNOSIS — G8929 Other chronic pain: Secondary | ICD-10-CM | POA: Insufficient documentation

## 2014-03-15 DIAGNOSIS — Z8719 Personal history of other diseases of the digestive system: Secondary | ICD-10-CM | POA: Insufficient documentation

## 2014-03-15 DIAGNOSIS — L0390 Cellulitis, unspecified: Secondary | ICD-10-CM

## 2014-03-15 DIAGNOSIS — L0291 Cutaneous abscess, unspecified: Secondary | ICD-10-CM

## 2014-03-15 DIAGNOSIS — IMO0002 Reserved for concepts with insufficient information to code with codable children: Secondary | ICD-10-CM | POA: Insufficient documentation

## 2014-03-15 MED ORDER — SULFAMETHOXAZOLE-TMP DS 800-160 MG PO TABS: 1.0000 | ORAL_TABLET | Freq: Two times a day (BID) | ORAL | Status: DC

## 2014-03-15 MED ORDER — LIDOCAINE HCL (PF) 2 % IJ SOLN
INTRAMUSCULAR | Status: AC
  Administered 2014-03-15: 08:00:00
  Filled 2014-03-15: qty 10

## 2014-03-15 NOTE — ED Notes (Signed)
Area dressed

## 2014-03-15 NOTE — ED Notes (Signed)
I&D set up. edp aware

## 2014-03-15 NOTE — ED Provider Notes (Signed)
CSN: [id number]     Arrival date & time 03/15/14  0709 History   This chart was scribed for April MelterElliott L Stonewall Doss, MD by Luisa DagoPriscilla Tutu, ED Scribe. This patient was seen in room APA11/APA11 and the patient's care was started at 7:30 AM.    Chief Complaint  Patient presents with  . Abscess   The history is provided by the patient.   HPI Comments: April Boyer is a 33 y.o. female who presents to the Emergency Department complaining of an abscess to her right thigh that occurred approximately 1 weeks ago. Pt states that she "squeezed" the abscess and she is experiencing some  mild swelling and redness to the effected area. She is also complaining of associated pain. Pt denies any other pertinent medical history.    Past Medical History  Diagnosis Date  . Acid reflux   . Chronic back pain   . Renal disorder     kidney stones  . Insomnia    Past Surgical History  Procedure Laterality Date  . Foot surgery    . Tubal ligation     History reviewed. No pertinent family history. History  Substance Use Topics  . Smoking status: Current Every Day Smoker -- 1.00 packs/day    Types: Cigarettes  . Smokeless tobacco: Not on file  . Alcohol Use: No   OB History   Grav Para Term Preterm Abortions TAB SAB Ect Mult Living                 Review of Systems  Skin: Positive for wound (abscess to right thigh).  All other systems reviewed and are negative.     Allergies  Review of patient's allergies indicates no known allergies.  Home Medications   Prior to Admission medications   Medication Sig Start Date End Date Taking? Authorizing Provider  Aspirin-Acetaminophen-Caffeine (GOODY HEADACHE PO) Take 1 Package by mouth 2 (two) times daily as needed (for pain).    Historical Provider, MD  cyclobenzaprine (FLEXERIL) 10 MG tablet Take 1 tablet (10 mg total) by mouth 3 (three) times daily as needed. 06/04/13   Tammy L. Triplett, PA-C  cyclobenzaprine (FLEXERIL) 10 MG tablet Take 1 tablet (10 mg  total) by mouth 3 (three) times daily as needed. 02/09/14   Tammy L. Triplett, PA-C  HYDROcodone-acetaminophen (NORCO/VICODIN) 5-325 MG per tablet Take one-two tabs po q 4-6 hrs prn pain 06/04/13   Tammy L. Triplett, PA-C  HYDROcodone-acetaminophen (NORCO/VICODIN) 5-325 MG per tablet Take one-two tabs po q 4-6 hrs prn pain 02/09/14   Tammy L. Triplett, PA-C  predniSONE (DELTASONE) 10 MG tablet Take 6 tablets day one, 5 tablets day two, 4 tablets day three, 3 tablets day four, 2 tablets day five, then 1 tablet day six 06/04/13   Tammy L. Triplett, PA-C  predniSONE (DELTASONE) 10 MG tablet Take 6 tablets day one, 5 tablets day two, 4 tablets day three, 3 tablets day four, 2 tablets day five, then 1 tablet day six 02/09/14   Tammy L. Triplett, PA-C   Triage Vitals: BP 131/88  Pulse 86  Temp(Src) 97.9 F (36.6 C) (Oral)  Resp 18  SpO2 100%  LMP 03/09/2014  Physical Exam  Nursing note and vitals reviewed. Constitutional: She is oriented to person, place, and time. She appears well-developed and well-nourished.  HENT:  Head: Normocephalic and atraumatic.  Eyes: Conjunctivae and EOM are normal. Pupils are equal, round, and reactive to light.  Neck: Normal range of motion and phonation normal. Neck supple.  Cardiovascular: Normal rate.   Pulmonary/Chest: Effort normal.  Musculoskeletal: Normal range of motion.  Neurological: She is alert and oriented to person, place, and time. She exhibits normal muscle tone.  Skin: Skin is warm and dry.  Right upper thigh: there is a 2 cm red and indurated are with surrounding erythema approximately 4 cm wide. Centrally there is scab; there is no drainage and no deep tissue tenderness.  Psychiatric: She has a normal mood and affect. Her behavior is normal. Judgment and thought content normal.    ED Course  Procedures (including critical care time)  DIAGNOSTIC STUDIES: Oxygen Saturation is 100% on RA, normal by my interpretation.    COORDINATION OF  CARE: Medications  lidocaine (XYLOCAINE) 2 % injection (  Given by Other 03/15/14 0745)    Patient Vitals for the past 24 hrs:  BP Temp Temp src Pulse Resp SpO2  03/15/14 0718 131/88 mmHg 97.9 F (36.6 C) Oral 86 18 100 %   7:41 AM- Will perform an incision and drainage procedure. Pt advised of plan for treatment and pt agrees.  INCISION AND DRAINAGE  Performed by: April BernhardtElliot Lon Klippel, MD Authorized by: April BernhardtElliot Micky Sheller, MD  Consent - Verbal Consent obtained Risks and benefits: risks/benefits and alternatives were discussed  Type: Abscess  Body Area: Right thigh  Anesthesia: Local infiltration Local anesthetic: lidocaine 2%  Anesthetic total: 2.5 ml  Complexity: Complex  Blunt dissection to break up loculations  Drainage: purulent  Drainage amount: small  Packing material: no packing used  Patient tolerance: Patient tolerated the procedure well with no immediate complications   MDM   Final diagnoses:  Abscess  Cellulitis   Cutaneous abscess, with mild surrounding cellulitis. Doubt systemic infection, metabolic instability, or impending vascular collapse.   Nursing Notes Reviewed/ Care Coordinated Applicable Imaging Reviewed Interpretation of Laboratory Data incorporated into ED treatment  The patient appears reasonably screened and/or stabilized for discharge and I doubt any other medical condition or other [facility] requiring further screening, evaluation, or treatment in the ED at this time prior to discharge.  Plan: Home Medications- Septra; Home Treatments- warm soaks; return here if the recommended treatment, does not improve the symptoms; Recommended follow up- PCP of choice prn  I personally performed the services described in this documentation, which was scribed in my presence. The recorded information has been reviewed and is accurate.     April MelterElliott L Zayli Villafuerte, MD 03/15/14 1524

## 2014-03-15 NOTE — ED Notes (Signed)
Pt noticed abscess to right lateral upper thigh x 1 week ago and states is getting worse. Small black head noted with redness around. nad

## 2014-03-15 NOTE — Discharge Instructions (Signed)
Use a warm compress on, or soak the affected area 3 times a day for 30 minutes   Abscess An abscess is an infected area that contains a collection of pus and debris.It can occur in almost any part of the body. An abscess is also known as a furuncle or boil. CAUSES  An abscess occurs when tissue gets infected. This can occur from blockage of oil or sweat glands, infection of hair follicles, or a minor injury to the skin. As the body tries to fight the infection, pus collects in the area and creates pressure under the skin. This pressure causes pain. People with weakened immune systems have difficulty fighting infections and get certain abscesses more often.  SYMPTOMS Usually an abscess develops on the skin and becomes a painful mass that is red, warm, and tender. If the abscess forms under the skin, you may feel a moveable soft area under the skin. Some abscesses break open (rupture) on their own, but most will continue to get worse without care. The infection can spread deeper into the body and eventually into the bloodstream, causing you to feel ill.  DIAGNOSIS  Your caregiver will take your medical history and perform a physical exam. A sample of fluid may also be taken from the abscess to determine what is causing your infection. TREATMENT  Your caregiver may prescribe antibiotic medicines to fight the infection. However, taking antibiotics alone usually does not cure an abscess. Your caregiver may need to make a small cut (incision) in the abscess to drain the pus. In some cases, gauze is packed into the abscess to reduce pain and to continue draining the area. HOME CARE INSTRUCTIONS   Only take over-the-counter or prescription medicines for pain, discomfort, or fever as directed by your caregiver.  If you were prescribed antibiotics, take them as directed. Finish them even if you start to feel better.  If gauze is used, follow your caregiver's directions for changing the gauze.  To avoid  spreading the infection:  Keep your draining abscess covered with a bandage.  Wash your hands well.  Do not share personal care items, towels, or whirlpools with others.  Avoid skin contact with others.  Keep your skin and clothes clean around the abscess.  Keep all follow-up appointments as directed by your caregiver. SEEK MEDICAL CARE IF:   You have increased pain, swelling, redness, fluid drainage, or bleeding.  You have muscle aches, chills, or a general ill feeling.  You have a fever. MAKE SURE YOU:   Understand these instructions.  Will watch your condition.  Will get help right away if you are not doing well or get worse. Document Released: 08/03/2005 Document Revised: 04/24/2012 Document Reviewed: 01/06/2012 [facility] Patient Information 2014 [place], [place].  Cellulitis Cellulitis is an infection of the skin and the tissue under the skin. The infected area is usually red and tender. This happens most often in the arms and lower legs. HOME CARE   Take your antibiotic medicine as told. Finish the medicine even if you start to feel better.  Keep the infected arm or leg raised (elevated).  Put a warm cloth on the area up to 4 times per day.  Only take medicines as told by your doctor.  Keep all doctor visits as told. GET HELP RIGHT AWAY IF:   You have a fever.  You feel very sleepy.  You throw up (vomit) or have watery poop (diarrhea).  You feel sick and have muscle aches and pains.  You  see red streaks on the skin coming from the infected area.  Your red area gets bigger or turns a dark color.  Your bone or joint under the infected area is painful after the skin heals.  Your infection comes back in the same area or different area.  You have a puffy (swollen) bump in the infected area.  You have new symptoms. MAKE SURE YOU:   Understand these instructions.  Will watch your condition.  Will get help right away if you are not doing well or get  worse. Document Released: 04/11/2008 Document Revised: 04/24/2012 Document Reviewed: 01/09/2012 [facility] Patient Information 2014 [place], [place].

## 2014-03-23 IMAGING — CT CT ABD-PELV W/ CM
2 of 4 series · 16 of 46 positions shown, 18 images · IV contrast (Omnipaque 300)
Comparison: 09/10/2011

CLINICAL DATA: Right flank pain

CT ABDOMEN AND PELVIS WITH CONTRAST
TECHNIQUE: Multidetector CT imaging of the abdomen and pelvis was
performed following the standard protocol during bolus
administration of intravenous contrast.
Contrast: 100mL OMNIPAQUE IOHEXOL 300 MG/ML  SOLN

[Series 2: abd_pel_with 5.0 b40f · axial · 0.76mm/px · z∈[-456,+14]mm · 13 of 104 slices shown, 15 images]
[im 5/104  soft-tissue]
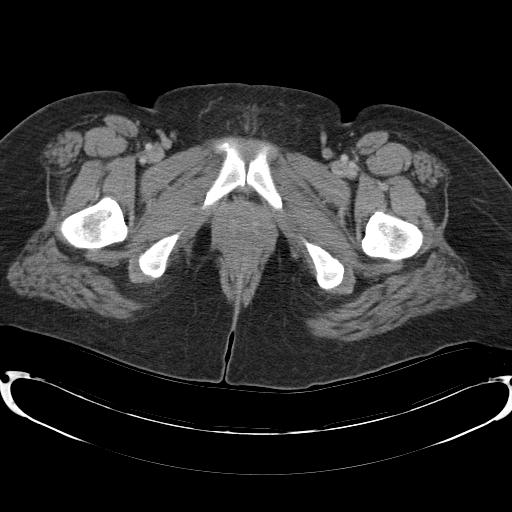
[im 5/104  bone]
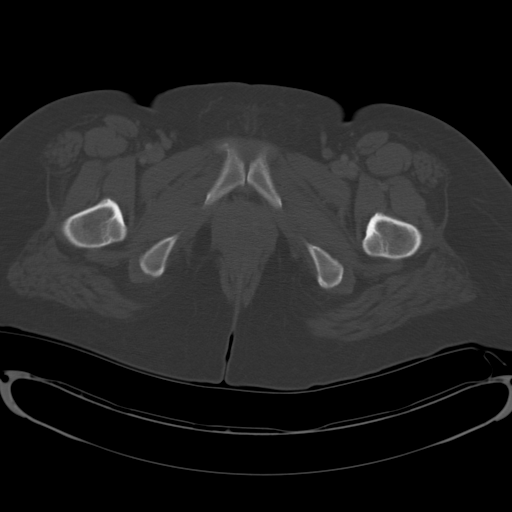
[im 13/104  soft-tissue]
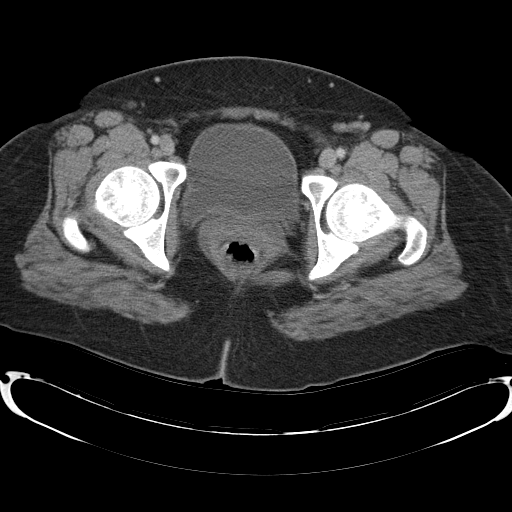
[im 21/104  soft-tissue]
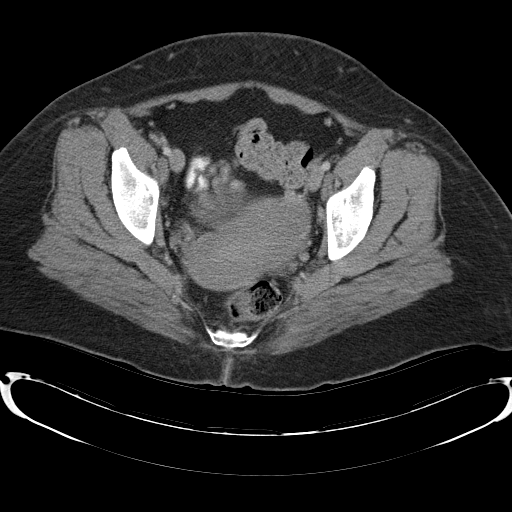
[im 29/104  soft-tissue]
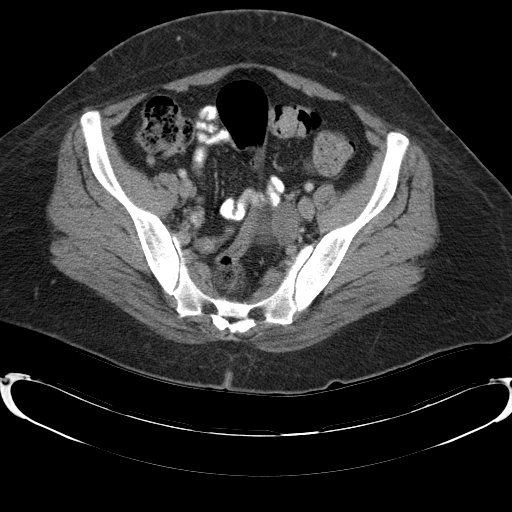
[im 38/104  soft-tissue]
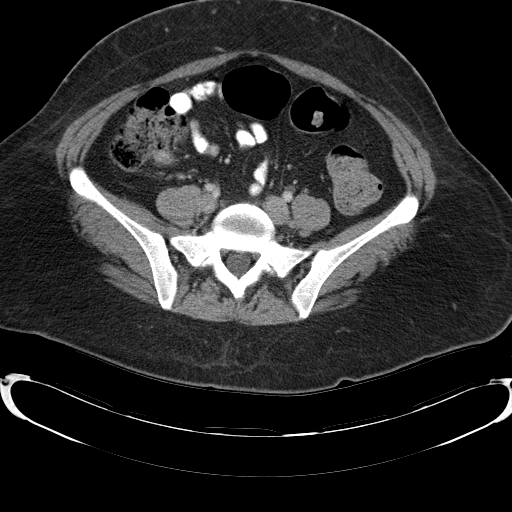
[im 46/104  soft-tissue]
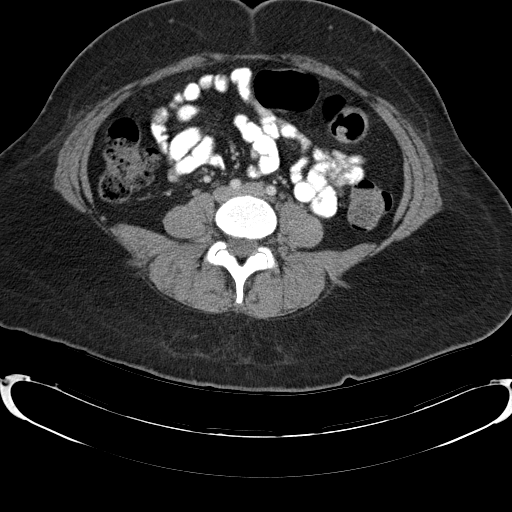
[im 54/104  soft-tissue]
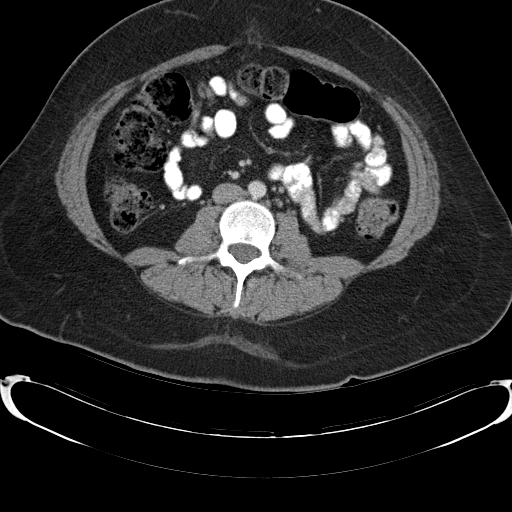
[im 58/104  soft-tissue]
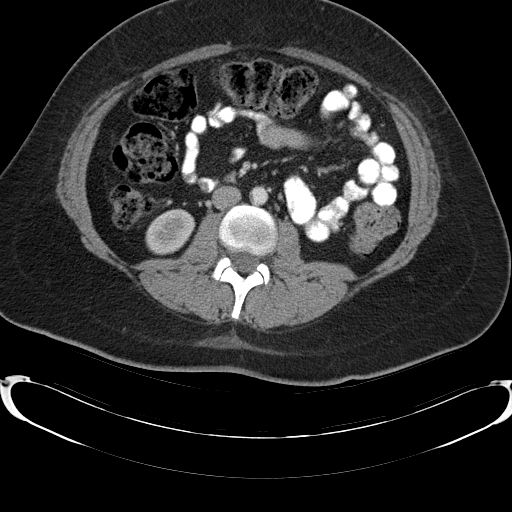
[im 66/104  soft-tissue]
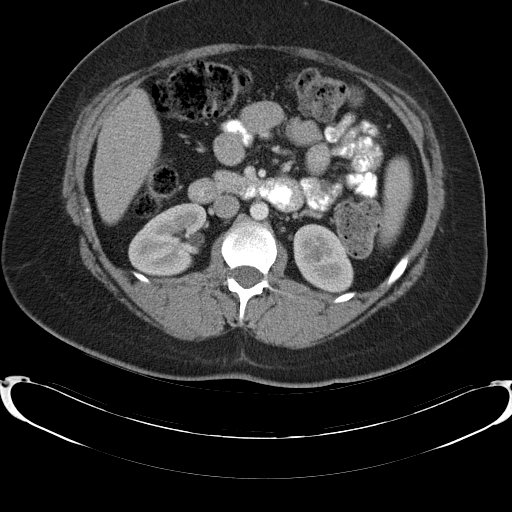
[im 66/104  bone]
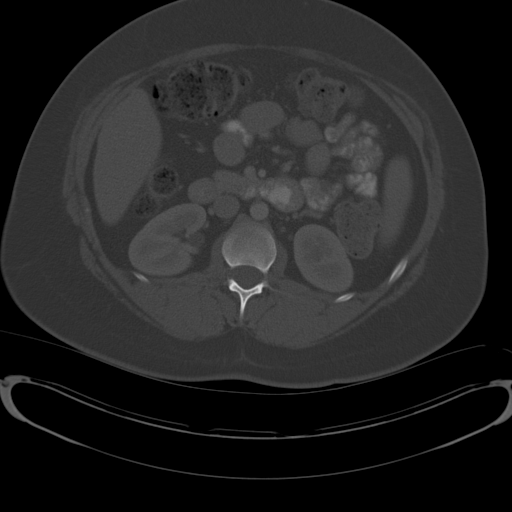
[im 75/104  soft-tissue]
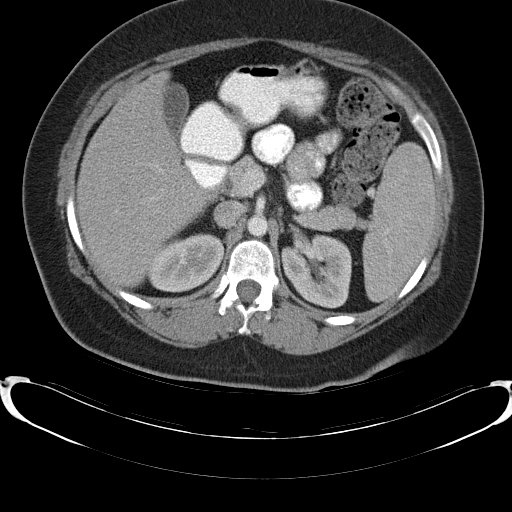
[im 83/104  soft-tissue]
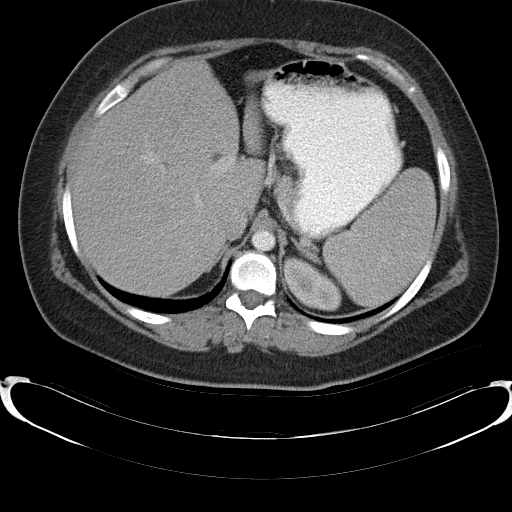
[im 91/104  soft-tissue]
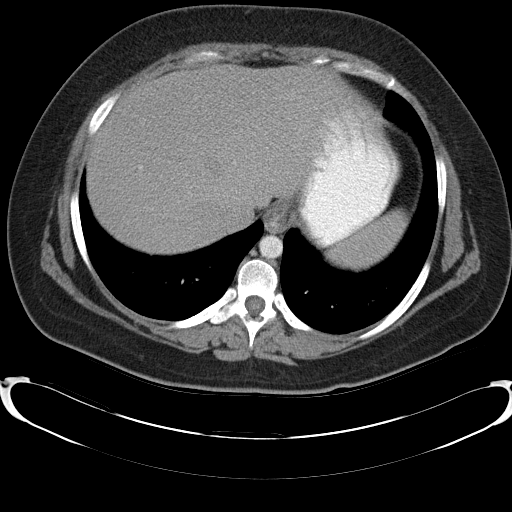
[im 99/104  soft-tissue]
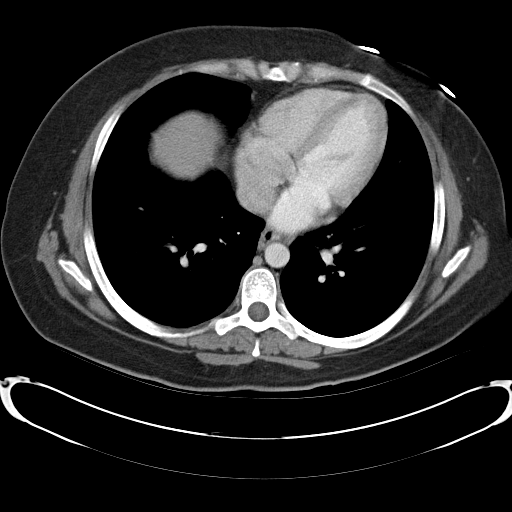

[Series 4: abd_pel_with 3.0 spo cor · coronal · 0.77mm/px · 3 of 103 slices shown]
[im 35/103  soft-tissue]
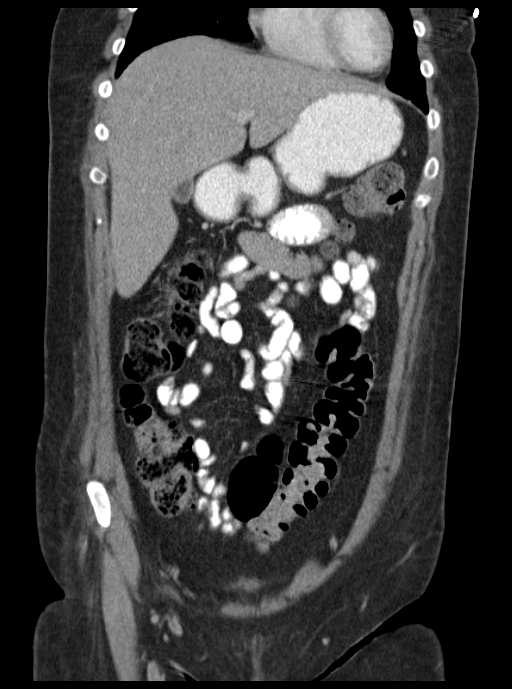
[im 46/103  soft-tissue]
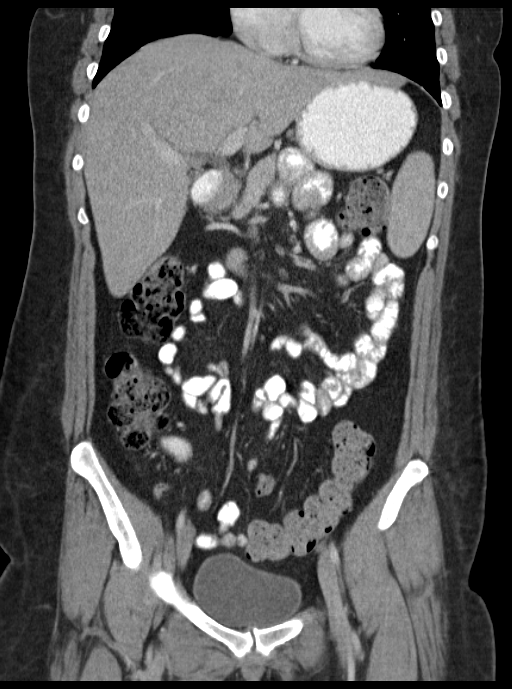
[im 57/103  soft-tissue]
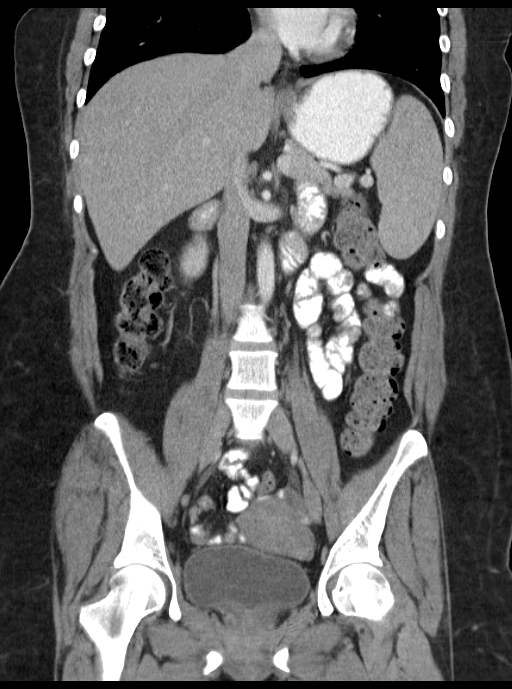

[16 of 46 positions shown; findings below may reference images not displayed]

FINDINGS: Mild ground-glass opacity within the left lower lobe.
Otherwise lung bases are clear.  Heart size within normal limits.
No pleural or pericardial effusion.

Diffuse low attenuation of the liver without focal abnormality.
Unremarkable biliary system, spleen, pancreas, adrenal glands.

Symmetric renal enhancement.  No hydronephrosis or hydroureter.  No
urinary tract calculi identified.

No bowel obstruction.  No CT evidence for colitis.  Normal
appendix.  No free intraperitoneal air or fluid.  No
lymphadenopathy.

There is mild atherosclerotic plaque of the infrarenal aorta.

Nonspecific appearance to the uterus. No adnexal mass.  Thin-walled
bladder.

No acute osseous finding.
IMPRESSION: No acute CT abnormality identified. No hydronephrosis.  Normal
appendix.

Hepatic steatosis.

Mild atherosclerotic disease of the aorta.

## 2014-05-03 ENCOUNTER — Encounter (HOSPITAL_COMMUNITY): Payer: Self-pay | Admitting: Emergency Medicine

## 2014-05-03 ENCOUNTER — Emergency Department (HOSPITAL_COMMUNITY)
Admission: EM | Admit: 2014-05-03 | Discharge: 2014-05-03 | Disposition: A | Discharge status: Home / Self Care | Payer: BC Managed Care – PPO | Attending: Emergency Medicine | Admitting: Emergency Medicine

## 2014-05-03 DIAGNOSIS — IMO0002 Reserved for concepts with insufficient information to code with codable children: Secondary | ICD-10-CM | POA: Insufficient documentation

## 2014-05-03 DIAGNOSIS — G8929 Other chronic pain: Secondary | ICD-10-CM | POA: Insufficient documentation

## 2014-05-03 DIAGNOSIS — Z87442 Personal history of urinary calculi: Secondary | ICD-10-CM | POA: Insufficient documentation

## 2014-05-03 DIAGNOSIS — Z9851 Tubal ligation status: Secondary | ICD-10-CM | POA: Insufficient documentation

## 2014-05-03 DIAGNOSIS — F17200 Nicotine dependence, unspecified, uncomplicated: Secondary | ICD-10-CM | POA: Insufficient documentation

## 2014-05-03 DIAGNOSIS — Z79899 Other long term (current) drug therapy: Secondary | ICD-10-CM | POA: Insufficient documentation

## 2014-05-03 DIAGNOSIS — Z7982 Long term (current) use of aspirin: Secondary | ICD-10-CM | POA: Insufficient documentation

## 2014-05-03 DIAGNOSIS — N390 Urinary tract infection, site not specified: Secondary | ICD-10-CM | POA: Insufficient documentation

## 2014-05-03 DIAGNOSIS — Z3202 Encounter for pregnancy test, result negative: Secondary | ICD-10-CM | POA: Insufficient documentation

## 2014-05-03 DIAGNOSIS — K219 Gastro-esophageal reflux disease without esophagitis: Secondary | ICD-10-CM | POA: Insufficient documentation

## 2014-05-03 LAB — URINALYSIS, ROUTINE W REFLEX MICROSCOPIC
Bilirubin Urine: NEGATIVE
Glucose, UA: NEGATIVE mg/dL
HGB URINE DIPSTICK: NEGATIVE
Ketones, ur: NEGATIVE mg/dL
Nitrite: NEGATIVE
PROTEIN: NEGATIVE mg/dL
Specific Gravity, Urine: 1.015 (ref 1.005–1.030)
Urobilinogen, UA: 0.2 mg/dL (ref 0.0–1.0)
pH: 6.5 (ref 5.0–8.0)

## 2014-05-03 LAB — URINE MICROSCOPIC-ADD ON

## 2014-05-03 LAB — POC URINE PREG, ED: Preg Test, Ur: NEGATIVE

## 2014-05-03 MED ORDER — NITROFURANTOIN MACROCRYSTAL 100 MG PO CAPS
100.0000 mg | ORAL_CAPSULE | Freq: Once | ORAL | Status: AC
Start: 2014-05-03 — End: 2014-05-03
  Administered 2014-05-03: 100 mg via ORAL
  Filled 2014-05-03: qty 1

## 2014-05-03 MED ORDER — OXYCODONE-ACETAMINOPHEN 5-325 MG PO TABS
1.0000 | ORAL_TABLET | Freq: Once | ORAL | Status: AC
  Administered 2014-05-03: 1 via ORAL
  Filled 2014-05-03: qty 1

## 2014-05-03 MED ORDER — NAPROXEN 500 MG PO TABS: 500.0000 mg | ORAL_TABLET | Freq: Two times a day (BID) | ORAL | Status: DC

## 2014-05-03 MED ORDER — OXYCODONE-ACETAMINOPHEN 5-325 MG PO TABS: 1.0000 | ORAL_TABLET | ORAL | Status: DC | PRN

## 2014-05-03 MED ORDER — NITROFURANTOIN MONOHYD MACRO 100 MG PO CAPS: 100.0000 mg | ORAL_CAPSULE | Freq: Two times a day (BID) | ORAL | Status: DC

## 2014-05-03 NOTE — ED Provider Notes (Signed)
CSN: [id number]     Arrival date & time 05/03/14  0549 History   First MD Initiated Contact with Patient 05/03/14 [phone]     Chief Complaint  Patient presents with  . Abdominal Pain     (Consider location/radiation/quality/duration/timing/severity/associated sxs/prior Treatment) Patient is a 33 y.o. female presenting with abdominal pain. The history is provided by the patient.  Abdominal Pain She got up during the night to go to the bathroom to urinate and pain in her right flank with radiation to the right lower abdomen. She passed what she says either was a kidney stone or a blood clot in had some blood in her urine but pain seemed to subside a little bit. Pain is moderate and she rates it at 5/10. Pain is ongoing. There is no associated nausea or vomiting and she has not had any fever or chills. She has noted urinary frequency and urgency. Nothing makes the pain better nothing makes it worse.  Past Medical History  Diagnosis Date  . Acid reflux   . Chronic back pain   . Renal disorder     kidney stones  . Insomnia    Past Surgical History  Procedure Laterality Date  . Foot surgery    . Tubal ligation     History reviewed. No pertinent family history. History  Substance Use Topics  . Smoking status: Current Every Day Smoker -- 1.00 packs/day    Types: Cigarettes  . Smokeless tobacco: Not on file  . Alcohol Use: No   OB History   Grav Para Term Preterm Abortions TAB SAB Ect Mult Living                 Review of Systems  Gastrointestinal: Positive for abdominal pain.  All other systems reviewed and are negative.     Allergies  Review of patient's allergies indicates no known allergies.  Home Medications   Prior to Admission medications   Medication Sig Start Date End Date Taking? Authorizing Provider  Aspirin-Acetaminophen-Caffeine (GOODY HEADACHE PO) Take 1 Package by mouth 2 (two) times daily as needed (for pain).    Historical Provider, MD  cyclobenzaprine  (FLEXERIL) 10 MG tablet Take 1 tablet (10 mg total) by mouth 3 (three) times daily as needed. 06/04/13   Tammy L. Triplett, PA-C  cyclobenzaprine (FLEXERIL) 10 MG tablet Take 1 tablet (10 mg total) by mouth 3 (three) times daily as needed. 02/09/14   Tammy L. Triplett, PA-C  HYDROcodone-acetaminophen (NORCO/VICODIN) 5-325 MG per tablet Take one-two tabs po q 4-6 hrs prn pain 06/04/13   Tammy L. Triplett, PA-C  HYDROcodone-acetaminophen (NORCO/VICODIN) 5-325 MG per tablet Take one-two tabs po q 4-6 hrs prn pain 02/09/14   Tammy L. Triplett, PA-C  predniSONE (DELTASONE) 10 MG tablet Take 6 tablets day one, 5 tablets day two, 4 tablets day three, 3 tablets day four, 2 tablets day five, then 1 tablet day six 06/04/13   Tammy L. Triplett, PA-C  predniSONE (DELTASONE) 10 MG tablet Take 6 tablets day one, 5 tablets day two, 4 tablets day three, 3 tablets day four, 2 tablets day five, then 1 tablet day six 02/09/14   Tammy L. Triplett, PA-C  sulfamethoxazole-trimethoprim (BACTRIM DS) 800-160 MG per tablet Take 1 tablet by mouth 2 (two) times daily. 03/15/14   Flint MelterElliott L Wentz, MD   BP 110/69  Pulse 76  Temp(Src) 97.9 F (36.6 C) (Oral)  Resp 16  Ht 5\' 7"  (1.702 m)  Wt 215 lb (97.523 kg)  BMI  33.67 kg/m2  SpO2 100%  LMP 04/09/2014 Physical Exam  Nursing note and vitals reviewed.  33 year old female, resting comfortably and in no acute distress. Vital signs are normal. Oxygen saturation is 100%, which is normal. Head is normocephalic and atraumatic. PERRLA, EOMI. Oropharynx is clear. Neck is nontender and supple without adenopathy or JVD. Back is nontender in the midline. There is mild right CVA tenderness. Lungs are clear without rales, wheezes, or rhonchi. Chest is nontender. Heart has regular rate and rhythm without murmur. Abdomen is soft, flat, with mild tenderness in the right lower abdomen. There is no rebound or guarding. There are no masses or hepatosplenomegaly and peristalsis is  normoactive. Extremities have no cyanosis or edema, full range of motion is present. Skin is warm and dry without rash. Neurologic: Mental status is normal, cranial nerves are intact, there are no motor or sensory deficits.   ED Course  Procedures (including critical care time) Labs Review Results for orders placed during the hospital encounter of 05/03/14  URINALYSIS, ROUTINE W REFLEX MICROSCOPIC      Result Value Ref Range   Color, Urine YELLOW  YELLOW   APPearance CLOUDY (*) CLEAR   Specific Gravity, Urine 1.015  1.005 - 1.030   pH 6.5  5.0 - 8.0   Glucose, UA NEGATIVE  NEGATIVE mg/dL   Hgb urine dipstick NEGATIVE  NEGATIVE   Bilirubin Urine NEGATIVE  NEGATIVE   Ketones, ur NEGATIVE  NEGATIVE mg/dL   Protein, ur NEGATIVE  NEGATIVE mg/dL   Urobilinogen, UA 0.2  0.0 - 1.0 mg/dL   Nitrite NEGATIVE  NEGATIVE   Leukocytes, UA MODERATE (*) NEGATIVE  URINE MICROSCOPIC-ADD ON      Result Value Ref Range   Squamous Epithelial / LPF MANY (*) RARE   WBC, UA 11-20  <3 WBC/hpf   RBC / HPF 3-6  <3 RBC/hpf   Bacteria, UA FEW (*) RARE  POC URINE PREG, ED      Result Value Ref Range   Preg Test, Ur NEGATIVE  NEGATIVE    MDM   Final diagnoses:  Urinary tract infection without hematuria, site unspecified    Flank pain and urinary symptoms which seem consistent with urolithiasis and UTI. Old records are reviewed and she had a CT scan in 2014 which did not show any evidence of renal calculi so urolithiasis seems very unlikely. Urinalysis will be obtained.  Urinalysis confirms presence of a UTI. She is discharged with prescriptions for nitrofurantoin, naproxen, and oxycodone acetaminophen.  Dione Boozeavid Glick, MD 05/03/14 [phone]

## 2014-05-03 NOTE — Discharge Instructions (Signed)
Urinary Tract Infection °Urinary tract infections (UTIs) can develop anywhere along your urinary tract. Your urinary tract is your body's drainage system for removing wastes and extra water. Your urinary tract includes two kidneys, two ureters, a bladder, and a urethra. Your kidneys are a pair of bean-shaped organs. Each kidney is about the size of your fist. They are located below your ribs, one on each side of your spine. °CAUSES °Infections are caused by microbes, which are microscopic organisms, including fungi, viruses, and bacteria. These organisms are so small that they can only be seen through a microscope. Bacteria are the microbes that most commonly cause UTIs. °SYMPTOMS  °Symptoms of UTIs may vary by age and gender of the patient and by the location of the infection. Symptoms in young women typically include a frequent and intense urge to urinate and a painful, burning feeling in the bladder or urethra during urination. Older women and men are more likely to be tired, shaky, and weak and have muscle aches and abdominal pain. A fever may mean the infection is in your kidneys. Other symptoms of a kidney infection include pain in your back or sides below the ribs, nausea, and vomiting. °DIAGNOSIS °To diagnose a UTI, your caregiver will ask you about your symptoms. Your caregiver also will ask to provide a urine sample. The urine sample will be tested for bacteria and white blood cells. White blood cells are made by your body to help fight infection. °TREATMENT  °Typically, UTIs can be treated with medication. Because most UTIs are caused by a bacterial infection, they usually can be treated with the use of antibiotics. The choice of antibiotic and length of treatment depend on your symptoms and the type of bacteria causing your infection. °HOME CARE INSTRUCTIONS °· If you were prescribed antibiotics, take them exactly as your caregiver instructs you. Finish the medication even if you feel better after you  have only taken some of the medication. °· Drink enough water and fluids to keep your urine clear or pale yellow. °· Avoid caffeine, tea, and carbonated beverages. They tend to irritate your bladder. °· Empty your bladder often. Avoid holding urine for long periods of time. °· Empty your bladder before and after sexual intercourse. °· After a bowel movement, women should cleanse from front to back. Use each tissue only once. °SEEK MEDICAL CARE IF:  °· You have back pain. °· You develop a fever. °· Your symptoms do not begin to resolve within 3 days. °SEEK IMMEDIATE MEDICAL CARE IF:  °· You have severe back pain or lower abdominal pain. °· You develop chills. °· You have nausea or vomiting. °· You have continued burning or discomfort with urination. °MAKE SURE YOU:  °· Understand these instructions. °· Will watch your condition. °· Will get help right away if you are not doing well or get worse. °Document Released: 08/03/2005 Document Revised: 04/24/2012 Document Reviewed: 12/02/2011 °[employer]® Patient Information ©2015 [employer]. This information is not intended to replace advice given to you by your health care provider. Make sure you discuss any questions you have with your health care provider. ° °Nitrofurantoin tablets or capsules °What is this medicine? °NITROFURANTOIN (nye troe fyoor AN toyn) is an antibiotic. It is used to treat urinary tract infections. °This medicine may be used for other purposes; ask your health care provider or pharmacist if you have questions. °COMMON BRAND NAME(S): Macrobid, Macrodantin, Urotoin °What should I tell my health care provider before I take this medicine? °They need to   know if you have any of these conditions: -anemia -diabetes -glucose-6-phosphate dehydrogenase deficiency -kidney disease -liver disease -lung disease -other chronic illness -an unusual or allergic reaction to nitrofurantoin, other antibiotics, other medicines, foods, dyes or  preservatives -pregnant or trying to get pregnant -breast-feeding How should I use this medicine? Take this medicine by mouth with a glass of water. Follow the directions on the prescription label. Take this medicine with food or milk. Take your doses at regular intervals. Do not take your medicine more often than directed. Do not stop taking except on your doctor's advice. Talk to your pediatrician regarding the use of this medicine in children. While this drug may be prescribed for selected conditions, precautions do apply. Overdosage: If you think you have taken too much of this medicine contact a poison control center or emergency room at once. NOTE: This medicine is only for you. Do not share this medicine with others. What if I miss a dose? If you miss a dose, take it as soon as you can. If it is almost time for your next dose, take only that dose. Do not take double or extra doses. What may interact with this medicine? -antacids containing magnesium trisilicate -probenecid -quinolone antibiotics like ciprofloxacin, lomefloxacin, norfloxacin and ofloxacin -sulfinpyrazone This list may not describe all possible interactions. Give your health care provider a list of all the medicines, herbs, non-prescription drugs, or dietary supplements you use. Also tell them if you smoke, drink alcohol, or use illegal drugs. Some items may interact with your medicine. What should I watch for while using this medicine? Tell your doctor or health care professional if your symptoms do not improve or if you get new symptoms. Drink several glasses of water a day. If you are taking this medicine for a long time, visit your doctor for regular checks on your progress. If you are diabetic, you may get a false positive result for sugar in your urine with certain brands of urine tests. Check with your doctor. What side effects may I notice from receiving this medicine? Side effects that you should report to your  doctor or health care professional as soon as possible: -allergic reactions like skin rash or hives, swelling of the face, lips, or tongue -chest pain -cough -difficulty breathing -dizziness, drowsiness -fever or infection -joint aches or pains -pale or blue-tinted skin -redness, blistering, peeling or loosening of the skin, including inside the mouth -tingling, burning, pain, or numbness in hands or feet -unusual bleeding or bruising -unusually weak or tired -yellowing of eyes or skin Side effects that usually do not require medical attention (report to your doctor or health care professional if they continue or are bothersome): -dark urine -diarrhea -headache -loss of appetite -nausea or vomiting -temporary hair loss This list may not describe all possible side effects. Call your doctor for medical advice about side effects. You may report side effects to FDA at 1-800-FDA-1088. Where should I keep my medicine? Keep out of the reach of children. Store at room temperature between 15 and 30 degrees C (59 and 86 degrees F). Protect from light. Throw away any unused medicine after the expiration date. NOTE: This sheet is a summary. It may not cover all possible information. If you have questions about this medicine, talk to your doctor, pharmacist, or health care provider.  2015, [employer]. (2008-05-14 15:56:47)  Naproxen and naproxen sodium oral immediate-release tablets What is this medicine? NAPROXEN (na PROX en) is a non-steroidal anti-inflammatory drug (NSAID). It is used  to reduce swelling and to treat pain. This medicine may be used for dental pain, headache, or painful monthly periods. It is also used for painful joint and muscular problems such as arthritis, tendinitis, bursitis, and gout. This medicine may be used for other purposes; ask your health care provider or pharmacist if you have questions. COMMON BRAND NAME(S): Aflaxen, Aleve, Aleve Arthritis, All Day  Relief, Anaprox, Anaprox DS, Naprosyn What should I tell my health care provider before I take this medicine? They need to know if you have any of these conditions: -asthma -cigarette smoker -drink more than 3 alcohol containing drinks a day -heart disease or circulation problems such as heart failure or leg edema (fluid retention) -high blood pressure -kidney disease -liver disease -stomach bleeding or ulcers -an unusual or allergic reaction to naproxen, aspirin, other NSAIDs, other medicines, foods, dyes, or preservatives -pregnant or trying to get pregnant -breast-feeding How should I use this medicine? Take this medicine by mouth with a glass of water. Follow the directions on the prescription label. Take it with food if your stomach gets upset. Try to not lie down for at least 10 minutes after you take it. Take your medicine at regular intervals. Do not take your medicine more often than directed. Long-term, continuous use may increase the risk of heart attack or stroke. A special MedGuide will be given to you by the pharmacist with each prescription and refill. Be sure to read this information carefully each time. Talk to your pediatrician regarding the use of this medicine in children. Special care may be needed. Overdosage: If you think you have taken too much of this medicine contact a poison control center or emergency room at once. NOTE: This medicine is only for you. Do not share this medicine with others. What if I miss a dose? If you miss a dose, take it as soon as you can. If it is almost time for your next dose, take only that dose. Do not take double or extra doses. What may interact with this medicine? -alcohol -aspirin -cidofovir -diuretics -lithium -methotrexate -other drugs for inflammation like ketorolac or prednisone -pemetrexed -probenecid -warfarin This list may not describe all possible interactions. Give your health care provider a list of all the  medicines, herbs, non-prescription drugs, or dietary supplements you use. Also tell them if you smoke, drink alcohol, or use illegal drugs. Some items may interact with your medicine. What should I watch for while using this medicine? Tell your doctor or health care professional if your pain does not get better. Talk to your doctor before taking another medicine for pain. Do not treat yourself. This medicine does not prevent heart attack or stroke. In fact, this medicine may increase the chance of a heart attack or stroke. The chance may increase with longer use of this medicine and in people who have heart disease. If you take aspirin to prevent heart attack or stroke, talk with your doctor or health care professional. Do not take other medicines that contain aspirin, ibuprofen, or naproxen with this medicine. Side effects such as stomach upset, nausea, or ulcers may be more likely to occur. Many medicines available without a prescription should not be taken with this medicine. This medicine can cause ulcers and bleeding in the stomach and intestines at any time during treatment. Do not smoke cigarettes or drink alcohol. These increase irritation to your stomach and can make it more susceptible to damage from this medicine. Ulcers and bleeding can happen without warning symptoms  and can cause death. You may get drowsy or dizzy. Do not drive, use machinery, or do anything that needs mental alertness until you know how this medicine affects you. Do not stand or sit up quickly, especially if you are an older patient. This reduces the risk of dizzy or fainting spells. This medicine can cause you to bleed more easily. Try to avoid damage to your teeth and gums when you brush or floss your teeth. What side effects may I notice from receiving this medicine? Side effects that you should report to your doctor or health care professional as soon as possible: -black or bloody stools, blood in the urine or  vomit -blurred vision -chest pain -difficulty breathing or wheezing -nausea or vomiting -severe stomach pain -skin rash, skin redness, blistering or peeling skin, hives, or itching -slurred speech or weakness on one side of the body -swelling of eyelids, throat, lips -unexplained weight gain or swelling -unusually weak or tired -yellowing of eyes or skin Side effects that usually do not require medical attention (report to your doctor or health care professional if they continue or are bothersome): -constipation -headache -heartburn This list may not describe all possible side effects. Call your doctor for medical advice about side effects. You may report side effects to FDA at 1-800-FDA-1088. Where should I keep my medicine? Keep out of the reach of children. Store at room temperature between 15 and 30 degrees C (59 and 86 degrees F). Keep container tightly closed. Throw away any unused medicine after the expiration date. NOTE: This sheet is a summary. It may not cover all possible information. If you have questions about this medicine, talk to your doctor, pharmacist, or health care provider.  2015, [employer]. (2009-10-26 20:10:16)  Acetaminophen; Oxycodone tablets What is this medicine? ACETAMINOPHEN; OXYCODONE (a set a MEE noe fen; ox i KOE done) is a pain reliever. It is used to treat mild to moderate pain. This medicine may be used for other purposes; ask your health care provider or pharmacist if you have questions. COMMON BRAND NAME(S): Endocet, Magnacet, Narvox, Percocet, Perloxx, Primalev, Primlev, Roxicet, Xolox What should I tell my health care provider before I take this medicine? They need to know if you have any of these conditions: -brain tumor -Crohn's disease, inflammatory bowel disease, or ulcerative colitis -drug abuse or addiction -head injury -heart or circulation problems -if you often drink alcohol -kidney disease or problems going to the  bathroom -liver disease -lung disease, asthma, or breathing problems -an unusual or allergic reaction to acetaminophen, oxycodone, other opioid analgesics, other medicines, foods, dyes, or preservatives -pregnant or trying to get pregnant -breast-feeding How should I use this medicine? Take this medicine by mouth with a full glass of water. Follow the directions on the prescription label. Take your medicine at regular intervals. Do not take your medicine more often than directed. Talk to your pediatrician regarding the use of this medicine in children. Special care may be needed. Patients over 32 years old may have a stronger reaction and need a smaller dose. Overdosage: If you think you have taken too much of this medicine contact a poison control center or emergency room at once. NOTE: This medicine is only for you. Do not share this medicine with others. What if I miss a dose? If you miss a dose, take it as soon as you can. If it is almost time for your next dose, take only that dose. Do not take double or extra doses. What may  interact with this medicine? -alcohol -antihistamines -barbiturates like amobarbital, butalbital, butabarbital, methohexital, pentobarbital, phenobarbital, thiopental, and secobarbital -benztropine -drugs for bladder problems like solifenacin, trospium, oxybutynin, tolterodine, hyoscyamine, and methscopolamine -drugs for breathing problems like ipratropium and tiotropium -drugs for certain stomach or intestine problems like propantheline, homatropine methylbromide, glycopyrrolate, atropine, belladonna, and dicyclomine -general anesthetics like etomidate, ketamine, nitrous oxide, propofol, desflurane, enflurane, halothane, isoflurane, and sevoflurane -medicines for depression, anxiety, or psychotic disturbances -medicines for sleep -muscle relaxants -naltrexone -narcotic medicines (opiates) for pain -phenothiazines like perphenazine, thioridazine, chlorpromazine,  mesoridazine, fluphenazine, prochlorperazine, promazine, and trifluoperazine -scopolamine -tramadol -trihexyphenidyl This list may not describe all possible interactions. Give your health care provider a list of all the medicines, herbs, non-prescription drugs, or dietary supplements you use. Also tell them if you smoke, drink alcohol, or use illegal drugs. Some items may interact with your medicine. What should I watch for while using this medicine? Tell your doctor or health care professional if your pain does not go away, if it gets worse, or if you have new or a different type of pain. You may develop tolerance to the medicine. Tolerance means that you will need a higher dose of the medication for pain relief. Tolerance is normal and is expected if you take this medicine for a long time. Do not suddenly stop taking your medicine because you may develop a severe reaction. Your body becomes used to the medicine. This does NOT mean you are addicted. Addiction is a behavior related to getting and using a drug for a non-medical reason. If you have pain, you have a medical reason to take pain medicine. Your doctor will tell you how much medicine to take. If your doctor wants you to stop the medicine, the dose will be slowly lowered over time to avoid any side effects. You may get drowsy or dizzy. Do not drive, use machinery, or do anything that needs mental alertness until you know how this medicine affects you. Do not stand or sit up quickly, especially if you are an older patient. This reduces the risk of dizzy or fainting spells. Alcohol may interfere with the effect of this medicine. Avoid alcoholic drinks. There are different types of narcotic medicines (opiates) for pain. If you take more than one type at the same time, you may have more side effects. Give your health care provider a list of all medicines you use. Your doctor will tell you how much medicine to take. Do not take more medicine than  directed. Call emergency for help if you have problems breathing. The medicine will cause constipation. Try to have a bowel movement at least every 2 to 3 days. If you do not have a bowel movement for 3 days, call your doctor or health care professional. Do not take Tylenol (acetaminophen) or medicines that have acetaminophen with this medicine. Too much acetaminophen can be very dangerous. Many nonprescription medicines contain acetaminophen. Always read the labels carefully to avoid taking more acetaminophen. What side effects may I notice from receiving this medicine? Side effects that you should report to your doctor or health care professional as soon as possible: -allergic reactions like skin rash, itching or hives, swelling of the face, lips, or tongue -breathing difficulties, wheezing -confusion -light headedness or fainting spells -severe stomach pain -unusually weak or tired -yellowing of the skin or the whites of the eyes Side effects that usually do not require medical attention (report to your doctor or health care professional if they continue or are bothersome): -dizziness -  drowsiness -nausea -vomiting This list may not describe all possible side effects. Call your doctor for medical advice about side effects. You may report side effects to FDA at 1-800-FDA-1088. Where should I keep my medicine? Keep out of the reach of children. This medicine can be abused. Keep your medicine in a safe place to protect it from theft. Do not share this medicine with anyone. Selling or giving away this medicine is dangerous and against the law. Store at room temperature between 20 and 25 degrees C (68 and 77 degrees F). Keep container tightly closed. Protect from light. This medicine may cause accidental overdose and death if it is taken by other adults, children, or pets. Flush any unused medicine down the toilet to reduce the chance of harm. Do not use the medicine after the expiration  date. NOTE: This sheet is a summary. It may not cover all possible information. If you have questions about this medicine, talk to your doctor, pharmacist, or health care provider.  2015, [employer]. (2013-06-17 13:17:35)

## 2014-05-03 NOTE — ED Notes (Signed)
Pain in back and right lower abdomen starting last night. Blood with urination.

## 2014-05-03 NOTE — ED Notes (Signed)
Awaiting medication from pharmacy.

## 2014-05-03 NOTE — ED Notes (Signed)
Patient with no complaints at this time. Respirations even and unlabored. Skin warm/dry. Discharge instructions reviewed with patient at this time. Patient given opportunity to voice concerns/ask questions. Patient discharged at this time and left Emergency Department with steady gait.   

## 2014-07-15 ENCOUNTER — Emergency Department (HOSPITAL_COMMUNITY)
Admission: EM | Admit: 2014-07-15 | Discharge: 2014-07-15 | Disposition: A | Discharge status: Home / Self Care | Payer: BC Managed Care – PPO | Attending: Emergency Medicine | Admitting: Emergency Medicine

## 2014-07-15 ENCOUNTER — Encounter (HOSPITAL_COMMUNITY): Payer: Self-pay | Admitting: Emergency Medicine

## 2014-07-15 DIAGNOSIS — Z87442 Personal history of urinary calculi: Secondary | ICD-10-CM | POA: Insufficient documentation

## 2014-07-15 DIAGNOSIS — R1110 Vomiting, unspecified: Secondary | ICD-10-CM

## 2014-07-15 DIAGNOSIS — R112 Nausea with vomiting, unspecified: Secondary | ICD-10-CM | POA: Insufficient documentation

## 2014-07-15 DIAGNOSIS — G8929 Other chronic pain: Secondary | ICD-10-CM | POA: Insufficient documentation

## 2014-07-15 DIAGNOSIS — N390 Urinary tract infection, site not specified: Secondary | ICD-10-CM | POA: Insufficient documentation

## 2014-07-15 DIAGNOSIS — F17200 Nicotine dependence, unspecified, uncomplicated: Secondary | ICD-10-CM | POA: Insufficient documentation

## 2014-07-15 DIAGNOSIS — Z8719 Personal history of other diseases of the digestive system: Secondary | ICD-10-CM | POA: Insufficient documentation

## 2014-07-15 DIAGNOSIS — Z3202 Encounter for pregnancy test, result negative: Secondary | ICD-10-CM | POA: Insufficient documentation

## 2014-07-15 DIAGNOSIS — R197 Diarrhea, unspecified: Secondary | ICD-10-CM | POA: Insufficient documentation

## 2014-07-15 LAB — COMPREHENSIVE METABOLIC PANEL
ALT: 26 U/L (ref 0–35)
AST: 16 U/L (ref 0–37)
Albumin: 3.8 g/dL (ref 3.5–5.2)
Alkaline Phosphatase: 78 U/L (ref 39–117)
Anion gap: 11 (ref 5–15)
BUN: 11 mg/dL (ref 6–23)
CALCIUM: 9 mg/dL (ref 8.4–10.5)
CO2: 24 mEq/L (ref 19–32)
Chloride: 103 mEq/L (ref 96–112)
Creatinine, Ser: 0.63 mg/dL (ref 0.50–1.10)
GFR calc non Af Amer: >90 mL/min (ref >90)
Glucose, Bld: 108 mg/dL — ABNORMAL HIGH (ref 70–99)
Potassium: 3.9 mEq/L (ref 3.7–5.3)
SODIUM: 138 meq/L (ref 137–147)
TOTAL PROTEIN: 7.6 g/dL (ref 6.0–8.3)
Total Bilirubin: 0.2 mg/dL — ABNORMAL LOW (ref 0.3–1.2)

## 2014-07-15 LAB — CBC WITH DIFFERENTIAL/PLATELET
BASOS ABS: 0 10*3/uL (ref 0.0–0.1)
Basophils Relative: 0 % (ref 0–1)
EOS ABS: 0.3 10*3/uL (ref 0.0–0.7)
EOS PCT: 3 % (ref 0–5)
HCT: 39 % (ref 36.0–46.0)
Hemoglobin: 12.6 g/dL (ref 12.0–15.0)
Lymphocytes Relative: 31 % (ref 12–46)
Lymphs Abs: 3.4 10*3/uL (ref 0.7–4.0)
MCH: 26.5 pg (ref 26.0–34.0)
MCHC: 32.3 g/dL (ref 30.0–36.0)
MCV: 82.1 fL (ref 78.0–100.0)
Monocytes Absolute: 0.5 10*3/uL (ref 0.1–1.0)
Monocytes Relative: 5 % (ref 3–12)
Neutro Abs: 6.7 10*3/uL (ref 1.7–7.7)
Neutrophils Relative %: 61 % (ref 43–77)
PLATELETS: 304 10*3/uL (ref 150–400)
RBC: 4.75 MIL/uL (ref 3.87–5.11)
RDW: 17.4 % — AB (ref 11.5–15.5)
WBC: 11 10*3/uL — ABNORMAL HIGH (ref 4.0–10.5)

## 2014-07-15 LAB — URINE MICROSCOPIC-ADD ON

## 2014-07-15 LAB — URINALYSIS, ROUTINE W REFLEX MICROSCOPIC
Bilirubin Urine: NEGATIVE
Glucose, UA: NEGATIVE mg/dL
Hgb urine dipstick: NEGATIVE
Ketones, ur: NEGATIVE mg/dL
Nitrite: POSITIVE — AB
PROTEIN: NEGATIVE mg/dL
Specific Gravity, Urine: 1.01 (ref 1.005–1.030)
UROBILINOGEN UA: 0.2 mg/dL (ref 0.0–1.0)
pH: 7 (ref 5.0–8.0)

## 2014-07-15 LAB — PREGNANCY, URINE: Preg Test, Ur: NEGATIVE

## 2014-07-15 LAB — LIPASE, BLOOD: Lipase: 23 U/L (ref 11–59)

## 2014-07-15 MED ORDER — HYDROCODONE-ACETAMINOPHEN 5-325 MG PO TABS: 1.0000 | ORAL_TABLET | ORAL | Status: AC | PRN

## 2014-07-15 MED ORDER — ACETAMINOPHEN 325 MG PO TABS
650.0000 mg | ORAL_TABLET | Freq: Once | ORAL | Status: AC
  Administered 2014-07-15: 650 mg via ORAL
  Filled 2014-07-15: qty 2

## 2014-07-15 MED ORDER — SODIUM CHLORIDE 0.9 % IV SOLN
1000.0000 mL | Freq: Once | INTRAVENOUS | Status: AC
  Administered 2014-07-15: 1000 mL via INTRAVENOUS

## 2014-07-15 MED ORDER — ONDANSETRON HCL 4 MG PO TABS: 4.0000 mg | ORAL_TABLET | Freq: Four times a day (QID) | ORAL | Status: AC

## 2014-07-15 MED ORDER — CEPHALEXIN 500 MG PO CAPS: 500.0000 mg | ORAL_CAPSULE | Freq: Four times a day (QID) | ORAL | Status: AC

## 2014-07-15 MED ORDER — ONDANSETRON HCL 4 MG/2ML IJ SOLN
4.0000 mg | Freq: Once | INTRAMUSCULAR | Status: AC
  Administered 2014-07-15: 4 mg via INTRAVENOUS
  Filled 2014-07-15 (×2): qty 2

## 2014-07-15 MED ORDER — DEXTROSE 5 % IV SOLN
1.0000 g | Freq: Once | INTRAVENOUS | Status: AC
  Administered 2014-07-15: 1 g via INTRAVENOUS
  Filled 2014-07-15: qty 10

## 2014-07-15 MED ORDER — SODIUM CHLORIDE 0.9 % IV SOLN
1000.0000 mL | INTRAVENOUS | Status: DC
  Administered 2014-07-15: 1000 mL via INTRAVENOUS

## 2014-07-15 NOTE — ED Provider Notes (Signed)
CSN: [id number]     Arrival date & time 07/15/14  9147 History  This chart was scribed for Linwood Dibbles, MD by Bronson Curb, ED Scribe. This patient was seen in room APA18/APA18 and the patient's care was started at 8:36 PM.    Chief Complaint  Patient presents with  . Nausea  . Emesis     The history is provided by the patient. No language interpreter was used.    HPI Comments: April Boyer is a 33 y.o. female who presents to the Emergency Department complaining of nausea and emesis since last night. Patient reports 4 episodes of emesis (last episode 2 hours ago). There is associated generalized abdominal pain and diarrhea. Patient has tried Tylenol and Pepto Bismol without significant improvement. She denies fever. Patient has history of GERD, chronic back pani, kidney stones, and insomnia.    Past Medical History  Diagnosis Date  . Acid reflux   . Chronic back pain   . Renal disorder     kidney stones  . Insomnia    Past Surgical History  Procedure Laterality Date  . Foot surgery    . Tubal ligation     No family history on file. History  Substance Use Topics  . Smoking status: Current Every Day Smoker -- 1.00 packs/day    Types: Cigarettes  . Smokeless tobacco: Not on file  . Alcohol Use: No   OB History   Grav Para Term Preterm Abortions TAB SAB Ect Mult Living                 Review of Systems A complete 10 system review of systems was obtained and all systems are negative except as noted in the HPI and PMH.     Allergies  Review of patient's allergies indicates no known allergies.  Home Medications   Prior to Admission medications   Medication Sig Start Date End Date Taking? Authorizing Provider  acetaminophen (TYLENOL) 500 MG tablet Take 500 mg by mouth every 6 (six) hours as needed for mild pain or moderate pain.   Yes Historical Provider, MD  Aspirin-Acetaminophen-Caffeine (GOODY HEADACHE PO) Take 1 Package by mouth 2 (two) times daily as needed  (for pain).   Yes Historical Provider, MD  bismuth subsalicylate (PEPTO BISMOL) 262 MG/15ML suspension Take 30 mLs by mouth every 6 (six) hours as needed for indigestion or diarrhea or loose stools.   Yes Historical Provider, MD  cephALEXin (KEFLEX) 500 MG capsule Take 1 capsule (500 mg total) by mouth 4 (four) times daily. 07/15/14   Linwood Dibbles, MD  HYDROcodone-acetaminophen (NORCO/VICODIN) 5-325 MG per tablet Take 1-2 tablets by mouth every 4 (four) hours as needed. 07/15/14   Linwood Dibbles, MD  ondansetron (ZOFRAN) 4 MG tablet Take 1 tablet (4 mg total) by mouth every 6 (six) hours. 07/15/14   Linwood Dibbles, MD   Triage Vitals: BP 114/89  Pulse 73  Temp(Src) 97.8 F (36.6 C)  Resp 14  Ht  (1.702 m)  Wt 193 lb (87.544 kg)  BMI 30.22 kg/m2  SpO2 100%  LMP 07/08/2014  Physical Exam  Nursing note and vitals reviewed. Constitutional: She appears well-developed and well-nourished. No distress.  HENT:  Head: Normocephalic and atraumatic.  Right Ear: External ear normal.  Left Ear: External ear normal.  Eyes: Conjunctivae are normal. Right eye exhibits no discharge. Left eye exhibits no discharge. No scleral icterus.  Neck: Neck supple. No tracheal deviation present.  Cardiovascular: Normal rate, regular rhythm and intact  distal pulses.   Pulmonary/Chest: Effort normal and breath sounds normal. No stridor. No respiratory distress. She has no wheezes. She has no rales.  Abdominal: Soft. Bowel sounds are normal. She exhibits no distension and no mass. There is generalized tenderness. There is no rigidity, no rebound and no guarding. No hernia.  Musculoskeletal: She exhibits no edema and no tenderness.  Neurological: She is alert. She has normal strength. No cranial nerve deficit (no facial droop, extraocular movements intact, no slurred speech) or sensory deficit. She exhibits normal muscle tone. She displays no seizure activity. Coordination normal.  Skin: Skin is warm and dry. No rash noted.   Psychiatric: She has a normal mood and affect.    ED Course  Procedures (including critical care time)  DIAGNOSTIC STUDIES: Oxygen Saturation is 100% on room air, normal by my interpretation.    COORDINATION OF CARE: At 2041 Discussed treatment plan with patient which includes labs and Zofran. Patient agrees.   Labs Review Labs Reviewed  CBC WITH DIFFERENTIAL - Abnormal; Notable for the following:    WBC 11.0 (*)    RDW 17.4 (*)    All other components within normal limits  COMPREHENSIVE METABOLIC PANEL - Abnormal; Notable for the following:    Glucose, Bld 108 (*)    Total Bilirubin 0.2 (*)    All other components within normal limits  URINALYSIS, ROUTINE W REFLEX MICROSCOPIC - Abnormal; Notable for the following:    APPearance HAZY (*)    Nitrite POSITIVE (*)    Leukocytes, UA LARGE (*)    All other components within normal limits  URINE MICROSCOPIC-ADD ON - Abnormal; Notable for the following:    Squamous Epithelial / LPF FEW (*)    Bacteria, UA MANY (*)    All other components within normal limits  LIPASE, BLOOD  PREGNANCY, URINE   CBC with a white count of 11.01 otherwise unremarkable I   MDM   Final diagnoses:  UTI (lower urinary tract infection)  Vomiting and diarrhea    UA suggest a uti.  Symptoms suggest GE however she could have a component of upper UTI.   Will d/c home with medications, pain meds.  No focal ttp on exam.  Doubt appendicitis, diverticulitis.  I personally performed the services described in this documentation, which was scribed in my presence.  The recorded information has been reviewed and is accurate.   Linwood Dibbles, MD 07/15/14 [phone]

## 2014-07-15 NOTE — Discharge Instructions (Signed)

## 2014-07-15 NOTE — ED Notes (Signed)
Pt states n&V since last night. With abd pain.

## 2019-08-23 ENCOUNTER — Other Ambulatory Visit: Payer: Self-pay

## 2019-08-23 ENCOUNTER — Encounter (HOSPITAL_COMMUNITY): Payer: Self-pay | Admitting: Emergency Medicine

## 2019-08-23 ENCOUNTER — Emergency Department (HOSPITAL_COMMUNITY)
Admission: EM | Admit: 2019-08-23 | Discharge: 2019-08-23 | Disposition: A | Discharge status: Home / Self Care | Payer: Medicaid - Out of State | Attending: Emergency Medicine | Admitting: Emergency Medicine

## 2019-08-23 DIAGNOSIS — R109 Unspecified abdominal pain: Secondary | ICD-10-CM | POA: Insufficient documentation

## 2019-08-23 DIAGNOSIS — Z5321 Procedure and treatment not carried out due to patient leaving prior to being seen by health care provider: Secondary | ICD-10-CM | POA: Insufficient documentation

## 2019-08-23 DIAGNOSIS — M549 Dorsalgia, unspecified: Secondary | ICD-10-CM | POA: Diagnosis present

## 2019-08-23 NOTE — ED Triage Notes (Signed)
Pt states she started having back and abd pain x 2 weeks ago, reports it went away but came back x 4 days ago, pt reports she has had a tubaligation but states she had 2 positive pregnancy tests yesterday

## 2023-02-08 ENCOUNTER — Encounter (HOSPITAL_COMMUNITY): Payer: Self-pay | Admitting: Emergency Medicine

## 2023-02-08 ENCOUNTER — Other Ambulatory Visit: Payer: Self-pay

## 2023-02-08 DIAGNOSIS — Z5321 Procedure and treatment not carried out due to patient leaving prior to being seen by health care provider: Secondary | ICD-10-CM | POA: Diagnosis not present

## 2023-02-08 DIAGNOSIS — R2243 Localized swelling, mass and lump, lower limb, bilateral: Secondary | ICD-10-CM | POA: Diagnosis present

## 2023-02-08 DIAGNOSIS — R519 Headache, unspecified: Secondary | ICD-10-CM | POA: Insufficient documentation

## 2023-02-08 NOTE — ED Triage Notes (Addendum)
Pt c/o bilateral leg swelling with redness and warmth that started today, pt further c/o headache, pt reports she takes Suboxone

## 2023-02-09 ENCOUNTER — Emergency Department (HOSPITAL_COMMUNITY)
Admission: EM | Admit: 2023-02-09 | Discharge: 2023-02-09 | Discharge status: Against Medical Advice | Payer: Medicaid Other | Attending: Emergency Medicine | Admitting: Emergency Medicine

## 2023-02-09 HISTORY — DX: Other psychoactive substance abuse, uncomplicated: F19.10

## 2023-03-20 ENCOUNTER — Other Ambulatory Visit: Payer: Self-pay

## 2023-03-20 ENCOUNTER — Emergency Department (HOSPITAL_COMMUNITY)
Admission: EM | Admit: 2023-03-20 | Discharge: 2023-03-21 | Disposition: A | Discharge status: Home / Self Care | Payer: Medicaid Other | Attending: Emergency Medicine | Admitting: Emergency Medicine

## 2023-03-20 ENCOUNTER — Encounter (HOSPITAL_COMMUNITY): Payer: Self-pay

## 2023-03-20 ENCOUNTER — Emergency Department (HOSPITAL_COMMUNITY): Payer: Medicaid Other

## 2023-03-20 DIAGNOSIS — Z7982 Long term (current) use of aspirin: Secondary | ICD-10-CM | POA: Insufficient documentation

## 2023-03-20 DIAGNOSIS — M7989 Other specified soft tissue disorders: Secondary | ICD-10-CM | POA: Insufficient documentation

## 2023-03-20 DIAGNOSIS — R0602 Shortness of breath: Secondary | ICD-10-CM | POA: Insufficient documentation

## 2023-03-20 LAB — URINALYSIS, ROUTINE W REFLEX MICROSCOPIC
Bilirubin Urine: NEGATIVE
Glucose, UA: NEGATIVE mg/dL
Hgb urine dipstick: NEGATIVE
Ketones, ur: NEGATIVE mg/dL
Nitrite: NEGATIVE
Protein, ur: NEGATIVE mg/dL
Specific Gravity, Urine: 1.013 (ref 1.005–1.030)
pH: 7 (ref 5.0–8.0)

## 2023-03-20 LAB — CBC WITH DIFFERENTIAL/PLATELET
Abs Immature Granulocytes: 0.04 10*3/uL (ref 0.00–0.07)
Basophils Absolute: 0 10*3/uL (ref 0.0–0.1)
Basophils Relative: 0 %
Eosinophils Absolute: 0.4 10*3/uL (ref 0.0–0.5)
Eosinophils Relative: 4 %
HCT: 39.7 % (ref 36.0–46.0)
Hemoglobin: 11.9 g/dL — ABNORMAL LOW (ref 12.0–15.0)
Immature Granulocytes: 0 %
Lymphocytes Relative: 25 %
Lymphs Abs: 2.9 10*3/uL (ref 0.7–4.0)
MCH: 24.3 pg — ABNORMAL LOW (ref 26.0–34.0)
MCHC: 30 g/dL (ref 30.0–36.0)
MCV: 81.2 fL (ref 80.0–100.0)
Monocytes Absolute: 0.5 10*3/uL (ref 0.1–1.0)
Monocytes Relative: 5 %
Neutro Abs: 7.7 10*3/uL (ref 1.7–7.7)
Neutrophils Relative %: 66 %
Platelets: 348 10*3/uL (ref 150–400)
RBC: 4.89 MIL/uL (ref 3.87–5.11)
RDW: 18.2 % — ABNORMAL HIGH (ref 11.5–15.5)
WBC: 11.6 10*3/uL — ABNORMAL HIGH (ref 4.0–10.5)
nRBC: 0 % (ref 0.0–0.2)

## 2023-03-20 LAB — BASIC METABOLIC PANEL
Anion gap: 8 (ref 5–15)
BUN: 9 mg/dL (ref 6–20)
CO2: 30 mmol/L (ref 22–32)
Calcium: 8.8 mg/dL — ABNORMAL LOW (ref 8.9–10.3)
Chloride: 100 mmol/L (ref 98–111)
Creatinine, Ser: 0.71 mg/dL (ref 0.44–1.00)
GFR, Estimated: >60 mL/min (ref >60)
Glucose, Bld: 101 mg/dL — ABNORMAL HIGH (ref 70–99)
Potassium: 3.5 mmol/L (ref 3.5–5.1)
Sodium: 138 mmol/L (ref 135–145)

## 2023-03-20 LAB — PREGNANCY, URINE: Preg Test, Ur: NEGATIVE

## 2023-03-20 LAB — BRAIN NATRIURETIC PEPTIDE: B Natriuretic Peptide: 36 pg/mL (ref 0.0–100.0)

## 2023-03-20 NOTE — ED Notes (Signed)
Ambulated patient with pulse ox. Patient reports feeling SOB while ambulating. SpO2 began at 98% on RA; while walking SpO2 decreased to 89-92%. MD notified

## 2023-03-20 NOTE — ED Triage Notes (Signed)
Pt c/o BLE swelling, pain when walking past 3-4 days, unable to sleep due to knee and ankle pain. Pt c/o shortness of breath x 2days, especially with exertion.

## 2023-03-20 NOTE — ED Provider Notes (Signed)
[place] EMERGENCY DEPARTMENT AT [facility] Provider Note   CSN: [id number] Arrival date & time: 03/20/23  2156     History {Add pertinent medical, surgical, social history, OB history to HPI:1} Chief Complaint  Patient presents with   Shortness of Breath    April Boyer is a 42 y.o. female.  HPI     This is a 42 year old female who presents with bilateral lower extremity swelling and shortness of breath.  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  She reports 2 to 3-day history of worsening shortness of breath.  It is worse with exertion and laying flat.  She has also had some lower extremity swelling.  No known history of heart disease.  Denies any fevers or cough.  Home Medications Prior to Admission medications   Medication Sig Start Date End Date Taking? Authorizing Provider  acetaminophen (TYLENOL) 500 MG tablet Take 500 mg by mouth every 6 (six) hours as needed for mild pain or moderate pain.    [provider]  Aspirin-Acetaminophen-Caffeine (GOODY HEADACHE PO) Take 1 Package by mouth 2 (two) times daily as needed (for pain).    [provider]  bismuth subsalicylate (PEPTO BISMOL) 262 MG/15ML suspension Take 30 mLs by mouth every 6 (six) hours as needed for indigestion or diarrhea or loose stools.    [provider]  cephALEXin (KEFLEX) 500 MG capsule Take 1 capsule (500 mg total) by mouth 4 (four) times daily. 07/15/14   Linwood Dibbles, MD  HYDROcodone-acetaminophen (NORCO/VICODIN) 5-325 MG per tablet Take 1-2 tablets by mouth every 4 (four) hours as needed. 07/15/14   Linwood Dibbles, MD  ondansetron (ZOFRAN) 4 MG tablet Take 1 tablet (4 mg total) by mouth every 6 (six) hours. 07/15/14   Linwood Dibbles, MD      Allergies    Patient has no known allergies.    Review of Systems   Review of Systems  Constitutional:  Negative for fever.  Respiratory:  Positive for shortness of breath.  Negative for cough.   Cardiovascular:  Positive for leg swelling.  All other systems reviewed and are negative.   Physical Exam Updated Vital Signs BP 111/69 (BP Location: Right Arm)   Pulse 90   Temp 98.2 F (36.8 C) (Oral)   Resp 18   Ht 1.702 m (5\' 7" )   Wt 122.5 kg   SpO2 97%   BMI 42.30 kg/m  Physical Exam Vitals and nursing note reviewed.  Constitutional:      Appearance: She is well-developed. She is obese. She is not ill-appearing.  HENT:     Head: Normocephalic and atraumatic.  Eyes:     Pupils: Pupils are equal, round, and reactive to light.  Cardiovascular:     Rate and Rhythm: Normal rate and regular rhythm.     Heart sounds: Normal heart sounds.  Pulmonary:     Effort: Pulmonary effort is normal. No respiratory distress.     Breath sounds: No wheezing.  Abdominal:     General: Bowel sounds are normal.     Palpations: Abdomen is soft.  Musculoskeletal:     Cervical back: Neck supple.     Comments: Trace to 1+ bilateral lower extremity edema  Skin:    General: Skin is warm and dry.  Neurological:     Mental Status: She is alert and oriented to person, place, and time.  Psychiatric:  Mood and Affect: Mood normal.     ED Results / Procedures / Treatments   Labs (all labs ordered are listed, but only abnormal results are displayed) Labs Reviewed  BASIC METABOLIC PANEL - Abnormal; Notable for the following components:      Result Value   Glucose, Bld 101 (*)    Calcium 8.8 (*)    All other components within normal limits  CBC WITH DIFFERENTIAL/PLATELET - Abnormal; Notable for the following components:   WBC 11.6 (*)    Hemoglobin 11.9 (*)    MCH 24.3 (*)    RDW 18.2 (*)    All other components within normal limits  URINALYSIS, ROUTINE W REFLEX MICROSCOPIC - Abnormal; Notable for the following components:   Leukocytes,Ua LARGE (*)    Bacteria, UA RARE (*)    All other components within normal limits  PREGNANCY, URINE  BRAIN NATRIURETIC  PEPTIDE    EKG EKG Interpretation  Date/Time:  Monday Mar 20 2023 22:14:54 EDT Ventricular Rate:  79 PR Interval:  147 QRS Duration: 89 QT Interval:  406 QTC Calculation: 466 R Axis:   77 Text Interpretation: Sinus rhythm No old tracing to compare Confirmed by Meridee Score [phone]) on 03/20/2023 10:28:07 PM  Radiology No results found.  Procedures Procedures  {Document cardiac monitor, telemetry assessment procedure when appropriate:1}  Medications Ordered in ED Medications - No data to display  ED Course/ Medical Decision Making/ A&P   {   Click here for ABCD2, HEART and other calculatorsREFRESH Note before signing :1}                          Medical Decision Making  ***  {Document critical care time when appropriate:1} {Document review of labs and clinical decision tools ie heart score, Chads2Vasc2 etc:1}  {Document your independent review of radiology images, and any outside records:1} {Document your discussion with family members, caretakers, and with consultants:1} {Document social determinants of health affecting pt's care:1} {Document your decision making why or why not admission, treatments were needed:1} Final Clinical Impression(s) / ED Diagnoses Final diagnoses:  None    Rx / DC Orders ED Discharge Orders     None

## 2023-03-21 ENCOUNTER — Emergency Department (HOSPITAL_COMMUNITY): Payer: Medicaid Other

## 2023-03-21 LAB — D-DIMER, QUANTITATIVE: D-Dimer, Quant: 0.53 ug/mL-FEU — ABNORMAL HIGH (ref 0.00–0.50)

## 2023-03-21 LAB — TROPONIN I (HIGH SENSITIVITY): Troponin I (High Sensitivity): 4 ng/L (ref <18)

## 2023-03-21 MED ORDER — FUROSEMIDE 20 MG PO TABS: 20.0000 mg | ORAL_TABLET | Freq: Every day | ORAL | 0 refills | Status: AC

## 2023-03-21 MED ORDER — IOHEXOL 350 MG/ML SOLN
100.0000 mL | Freq: Once | INTRAVENOUS | Status: AC | PRN
  Administered 2023-03-21: 100 mL via INTRAVENOUS

## 2023-03-21 MED ORDER — FUROSEMIDE 10 MG/ML IJ SOLN
20.0000 mg | Freq: Once | INTRAMUSCULAR | Status: AC
  Administered 2023-03-21: 20 mg via INTRAVENOUS
  Filled 2023-03-21: qty 2

## 2023-03-21 NOTE — ED Notes (Signed)
Patient transported to CT 

## 2023-03-21 NOTE — Discharge Instructions (Signed)
You were seen today for shortness of breath.  Your workup is largely reassuring.  Given your leg swelling you will be placed on a 5-day course of Lasix.  Follow-up with your primary physician.  You should consider smoking cessation as well.

## 2024-04-17 ENCOUNTER — Other Ambulatory Visit: Payer: Self-pay

## 2024-04-17 ENCOUNTER — Emergency Department (HOSPITAL_COMMUNITY)
Admission: EM | Admit: 2024-04-17 | Discharge: 2024-04-17 | Disposition: A | Discharge status: Home / Self Care | Attending: Emergency Medicine | Admitting: Emergency Medicine

## 2024-04-17 DIAGNOSIS — T403X4A Poisoning by methadone, undetermined, initial encounter: Secondary | ICD-10-CM | POA: Insufficient documentation

## 2024-04-17 DIAGNOSIS — Y900 Blood alcohol level of less than 20 mg/100 ml: Secondary | ICD-10-CM | POA: Diagnosis not present

## 2024-04-17 DIAGNOSIS — T50904A Poisoning by unspecified drugs, medicaments and biological substances, undetermined, initial encounter: Secondary | ICD-10-CM

## 2024-04-17 DIAGNOSIS — R400 Somnolence: Secondary | ICD-10-CM | POA: Insufficient documentation

## 2024-04-17 LAB — COMPREHENSIVE METABOLIC PANEL WITH GFR
ALT: 22 U/L (ref 0–44)
AST: 22 U/L (ref 15–41)
Albumin: 3.3 g/dL — ABNORMAL LOW (ref 3.5–5.0)
Alkaline Phosphatase: 79 U/L (ref 38–126)
Anion gap: 10 (ref 5–15)
BUN: 11 mg/dL (ref 6–20)
CO2: 26 mmol/L (ref 22–32)
Calcium: 8.8 mg/dL — ABNORMAL LOW (ref 8.9–10.3)
Chloride: 101 mmol/L (ref 98–111)
Creatinine, Ser: 0.64 mg/dL (ref 0.44–1.00)
GFR, Estimated: >60 mL/min (ref >60)
Glucose, Bld: 109 mg/dL — ABNORMAL HIGH (ref 70–99)
Potassium: 3.6 mmol/L (ref 3.5–5.1)
Sodium: 137 mmol/L (ref 135–145)
Total Bilirubin: 0.4 mg/dL (ref 0.0–1.2)
Total Protein: 7.7 g/dL (ref 6.5–8.1)

## 2024-04-17 LAB — CBC WITH DIFFERENTIAL/PLATELET
Abs Immature Granulocytes: 0.03 10*3/uL (ref 0.00–0.07)
Basophils Absolute: 0 10*3/uL (ref 0.0–0.1)
Basophils Relative: 0 %
Eosinophils Absolute: 0.4 10*3/uL (ref 0.0–0.5)
Eosinophils Relative: 3 %
HCT: 40.8 % (ref 36.0–46.0)
Hemoglobin: 12.5 g/dL (ref 12.0–15.0)
Immature Granulocytes: 0 %
Lymphocytes Relative: 32 %
Lymphs Abs: 3.6 10*3/uL (ref 0.7–4.0)
MCH: 24.9 pg — ABNORMAL LOW (ref 26.0–34.0)
MCHC: 30.6 g/dL (ref 30.0–36.0)
MCV: 81.1 fL (ref 80.0–100.0)
Monocytes Absolute: 0.6 10*3/uL (ref 0.1–1.0)
Monocytes Relative: 5 %
Neutro Abs: 6.7 10*3/uL (ref 1.7–7.7)
Neutrophils Relative %: 60 %
Platelets: 273 10*3/uL (ref 150–400)
RBC: 5.03 MIL/uL (ref 3.87–5.11)
RDW: 18.2 % — ABNORMAL HIGH (ref 11.5–15.5)
WBC: 11.2 10*3/uL — ABNORMAL HIGH (ref 4.0–10.5)
nRBC: 0 % (ref 0.0–0.2)

## 2024-04-17 LAB — HCG, SERUM, QUALITATIVE: Preg, Serum: NEGATIVE

## 2024-04-17 LAB — ETHANOL: Alcohol, Ethyl (B): <15 mg/dL (ref <15)

## 2024-04-17 MED ORDER — CHARCOAL ACTIVATED PO LIQD
50.0000 g | Freq: Once | ORAL | Status: DC
  Filled 2024-04-17: qty 240

## 2024-04-17 NOTE — ED Provider Notes (Signed)
 43 yo female here with suspected unintentional drug overdose, takes methadone prescribed, denies SI, reportedly wanted to get her methadone dose before going to jail.  She is in police custody  Physical Exam  BP 118/82   Pulse 88   Resp 12   Ht 5' 7 (1.702 m)   Wt 111.1 kg   SpO2 92%   BMI 38.37 kg/m   Physical Exam  Procedures  Procedures  ED Course / MDM    Medical Decision Making Amount and/or Complexity of Data Reviewed Labs: ordered.  Risk OTC drugs.   Plan to monitor, MTF  915 am -patient is awake and alert.  She is able to answer questions appropriately.  She does not have pinpoint pupils.  Temp 97.64F per RN.  I suspect that she is stable at this time for discharge into custody.  RR and pulse ox normalizes while awake        Arvilla Birmingham, MD 04/17/24 [phone]

## 2024-04-17 NOTE — ED Triage Notes (Signed)
 Pt bib [employer] per officer pt ingested unknown amount of medication approx 30 mins PTA because she was being detained. Pt states she ingested methadone and gabapentin. Pt is drowsy in triage.

## 2024-04-17 NOTE — ED Provider Notes (Signed)
 [place] EMERGENCY DEPARTMENT AT [facility] Provider Note   CSN: [id number] Arrival date & time: 04/17/24  0406     History  Chief Complaint  Patient presents with   Ingestion    April Boyer is a 43 y.o. female.  Patient brought to the emergency department for medical clearance because she was noted to take an unknown amount of medication while she was being detained.  Patient reports that she took 105 mg of methadone.  That is her daily dose of methadone.  She reports that the medication she had was her morning dose for today.  She took it while she was being detained because she was afraid that she would not get her meds in jail and would get sick.  She was not trying to harm herself.  She also took her normal dose of Neurontin earlier.       Home Medications Prior to Admission medications   Medication Sig Start Date End Date Taking? Authorizing Provider  acetaminophen  (TYLENOL ) 500 MG tablet Take 500 mg by mouth every 6 (six) hours as needed for mild pain or moderate pain.    [provider]  Aspirin-Acetaminophen -Caffeine (GOODY HEADACHE PO) Take 1 Package by mouth 2 (two) times daily as needed (for pain).    [provider]  bismuth subsalicylate (PEPTO BISMOL) 262 MG/15ML suspension Take 30 mLs by mouth every 6 (six) hours as needed for indigestion or diarrhea or loose stools.    [provider]  cephALEXin  (KEFLEX ) 500 MG capsule Take 1 capsule (500 mg total) by mouth 4 (four) times daily. 07/15/14   Trish Furl, MD  furosemide  (LASIX ) 20 MG tablet Take 1 tablet (20 mg total) by mouth daily. 03/21/23   Horton, Vonzella Guernsey, MD  HYDROcodone -acetaminophen  (NORCO/VICODIN) 5-325 MG per tablet Take 1-2 tablets by mouth every 4 (four) hours as needed. 07/15/14   Trish Furl, MD  ondansetron  (ZOFRAN ) 4 MG tablet Take 1 tablet (4 mg total) by mouth every 6 (six) hours. 07/15/14   Trish Furl, MD      Allergies    Patient has no known allergies.     Review of Systems   Review of Systems  Physical Exam Updated Vital Signs BP 131/87   Pulse 89   Resp 15   Ht 5' 7 (1.702 m)   Wt 111.1 kg   SpO2 92%   BMI 38.37 kg/m  Physical Exam Vitals and nursing note reviewed.  Constitutional:      General: She is not in acute distress.    Appearance: She is well-developed.  HENT:     Head: Normocephalic and atraumatic.     Mouth/Throat:     Mouth: Mucous membranes are moist.  Eyes:     General: Vision grossly intact. Gaze aligned appropriately.     Extraocular Movements: Extraocular movements intact.     Conjunctiva/sclera: Conjunctivae normal.  Cardiovascular:     Rate and Rhythm: Normal rate and regular rhythm.     Pulses: Normal pulses.     Heart sounds: Normal heart sounds, S1 normal and S2 normal. No murmur heard.    No friction rub. No gallop.  Pulmonary:     Effort: Pulmonary effort is normal. No respiratory distress.     Breath sounds: Normal breath sounds.  Abdominal:     General: Bowel sounds are normal.     Palpations: Abdomen is soft.     Tenderness: There is no abdominal tenderness. There is no guarding or rebound.  Hernia: No hernia is present.  Musculoskeletal:        General: No swelling.     Cervical back: Full passive range of motion without pain, normal range of motion and neck supple. No spinous process tenderness or muscular tenderness. Normal range of motion.     Right lower leg: No edema.     Left lower leg: No edema.  Skin:    General: Skin is warm and dry.     Capillary Refill: Capillary refill takes less than 2 seconds.     Findings: No ecchymosis, erythema, rash or wound.  Neurological:     General: No focal deficit present.     Mental Status: She is alert and oriented to person, place, and time.     GCS: GCS eye subscore is 4. GCS verbal subscore is 5. GCS motor subscore is 6.     Cranial Nerves: Cranial nerves 2-12 are intact.     Sensory: Sensation is intact.     Motor: Motor function  is intact.     Coordination: Coordination is intact.  Psychiatric:        Attention and Perception: Attention normal.        Mood and Affect: Mood normal.        Speech: Speech normal.        Behavior: Behavior normal.     ED Results / Procedures / Treatments   Labs (all labs ordered are listed, but only abnormal results are displayed) Labs Reviewed  CBC WITH DIFFERENTIAL/PLATELET - Abnormal; Notable for the following components:      Result Value   WBC 11.2 (*)    MCH 24.9 (*)    RDW 18.2 (*)    All other components within normal limits  COMPREHENSIVE METABOLIC PANEL WITH GFR - Abnormal; Notable for the following components:   Glucose, Bld 109 (*)    Calcium 8.8 (*)    Albumin 3.3 (*)    All other components within normal limits  HCG, SERUM, QUALITATIVE  ETHANOL  RAPID URINE DRUG SCREEN, HOSP PERFORMED    EKG EKG Interpretation Date/Time:  Wednesday April 17 2024 04:26:30 EDT Ventricular Rate:  96 PR Interval:  146 QRS Duration:  94 QT Interval:  377 QTC Calculation: 477 R Axis:   78  Text Interpretation: Sinus rhythm Borderline T abnormalities, anterior leads Confirmed by Ballard Bongo [phone]) on 04/17/2024 5:24:54 AM  Radiology No results found.  Procedures Procedures    Medications Ordered in ED Medications  charcoal activated (NO SORBITOL) (ACTIDOSE-AQUA) suspension 50 g (50 g Oral Not Given 04/17/24 0437)    ED Course/ Medical Decision Making/ A&P                                 Medical Decision Making Amount and/or Complexity of Data Reviewed Labs: ordered.  Risk OTC drugs.   Brought to the emergency department after ingestion.  Patient reports taking additional methadone and possibly gabapentin after she was detained by police.  She reports there was a single dose of each.  She denies suicidality.  She has somnolent but awakens.  Will monitor here for increased sedation and need for intervention, anticipate discharge into police  custody.        Final Clinical Impression(s) / ED Diagnoses Final diagnoses:  Overdose of undetermined intent, initial encounter    Rx / DC Orders ED Discharge Orders     None  Ballard Bongo, MD 04/17/24 [phone]
